# Patient Record
Sex: Female | Born: 1995 | Race: Black or African American | Hispanic: No | Marital: Single | Smoking: Never smoker
Health system: Southern US, Community
[De-identification: ages and names within clinical notes are randomized; demographics above are authoritative.]

## PROBLEM LIST (undated history)

## (undated) DIAGNOSIS — T7840XA Allergy, unspecified, initial encounter: Secondary | ICD-10-CM

## (undated) DIAGNOSIS — K219 Gastro-esophageal reflux disease without esophagitis: Secondary | ICD-10-CM

## (undated) DIAGNOSIS — N390 Urinary tract infection, site not specified: Secondary | ICD-10-CM

## (undated) HISTORY — DX: Gastro-esophageal reflux disease without esophagitis: K21.9

## (undated) HISTORY — DX: Allergy, unspecified, initial encounter: T78.40XA

## (undated) HISTORY — DX: Urinary tract infection, site not specified: N39.0

## (undated) HISTORY — PX: WISDOM TOOTH EXTRACTION: SHX21

---
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
## 2015-05-20 ENCOUNTER — Encounter: Payer: Self-pay | Admitting: Family

## 2015-05-20 NOTE — Progress Notes (Signed)
Error

## 2015-05-28 ENCOUNTER — Ambulatory Visit (INDEPENDENT_AMBULATORY_CARE_PROVIDER_SITE_OTHER): Payer: BLUE CROSS/BLUE SHIELD | Admitting: Internal Medicine

## 2015-05-28 ENCOUNTER — Encounter: Payer: Self-pay | Admitting: Internal Medicine

## 2015-05-28 VITALS — BP 104/68 | HR 93 | Temp 97.5°F | Ht 62.0 in | Wt 171.0 lb

## 2015-05-28 DIAGNOSIS — E669 Obesity, unspecified: Secondary | ICD-10-CM | POA: Diagnosis not present

## 2015-05-28 DIAGNOSIS — T7840XA Allergy, unspecified, initial encounter: Secondary | ICD-10-CM | POA: Diagnosis not present

## 2015-05-28 MED ORDER — METHYLPREDNISOLONE ACETATE 40 MG/ML IJ SUSP
40.0000 mg | Freq: Once | INTRAMUSCULAR | Status: AC
  Administered 2015-05-28: 40 mg via INTRAMUSCULAR

## 2015-05-28 MED ORDER — CETIRIZINE HCL 10 MG PO TABS: 10.0000 mg | ORAL_TABLET | Freq: Every day | ORAL | Status: DC

## 2015-05-28 MED ORDER — PREDNISONE 10 MG PO TABS: ORAL_TABLET | ORAL | Status: DC

## 2015-05-28 NOTE — Progress Notes (Signed)
Subjective:    Patient ID: Rachel Beck, female    DOB: Oct 29, 1995, 19 y.o.   MRN: [id number]  HPI She is here to establish with a new pcp.  She is concerned about itching.   She ate a tomato three days ago and had been itching all over.  Her mom is allergic to tomatoes and itches after eating them.  She has had tomatoes in the past and usually itches a little, but nothing this bad.  The other day her tongue felt numb, but it does not today.  She denies throat swelling or difficulty breathing. She took benadryl and it did not help.  She tried topical cortisone and it did not help.  Trying to lose weight:  She works out regularly and sees a Clinical research associate - works out at least three times a week.  She eats healthy - usually 6 meals a day.  She is frustrated by in inability to lose weight.   Medications and allergies reviewed with patient and updated if appropriate.  Patient Active Problem List   Diagnosis Date Noted  . Allergic reaction 05/28/2015  . Obese 05/28/2015    Past Medical History  Diagnosis Date  . Allergy   . UTI (lower urinary tract infection)     No past surgical history on file.  Social History   Social History  . Marital Status: Single    Spouse Name: N/A  . Number of Children: N/A  . Years of Education: N/A   Social History Main Topics  . Smoking status: Never Smoker   . Smokeless tobacco: Never Used  . Alcohol Use: No  . Drug Use: No  . Sexual Activity: Yes    Birth Control/ Protection: Condom   Other Topics Concern  . None   Social History Narrative   Exercises regularly      Part time school, working    Review of Systems  Constitutional: Negative for fever, chills, appetite change, fatigue and unexpected weight change.  HENT: Positive for trouble swallowing. Negative for hearing loss and sore throat.        No lip swelling  Eyes: Negative for visual disturbance.  Respiratory: Negative for cough, shortness of breath and wheezing.     Cardiovascular: Negative for chest pain, palpitations and leg swelling.  Gastrointestinal: Negative for nausea, abdominal pain, diarrhea and constipation.  Genitourinary: Negative for dysuria.  Musculoskeletal: Negative for myalgias, back pain and arthralgias.  Skin: Positive for rash (little bumps on hands and arms).       Whole body itching  Neurological: Negative for dizziness, light-headedness and headaches.  Psychiatric/Behavioral: Negative for dysphoric mood. The patient is not nervous/anxious.        Objective:   Filed Vitals:   05/28/15 1612  BP: 104/68  Pulse: 93  Temp: 97.5 F (36.4 C)   Filed Weights   05/28/15 1612  Weight: 171 lb (77.565 kg)   Body mass index is 31.27 kg/(m^2).   Physical Exam  Constitutional: She is oriented to person, place, and time. She appears well-developed and well-nourished. No distress.  HENT:  Head: Normocephalic and atraumatic.  Right Ear: External ear normal.  Left Ear: External ear normal.  Mouth/Throat: Oropharynx is clear and moist.  No throat swelling  Eyes: Conjunctivae are normal.  Neck: Normal range of motion. Neck supple. No tracheal deviation present. No thyromegaly present.  Cardiovascular: Normal rate, regular rhythm and normal heart sounds.   No murmur heard. Pulmonary/Chest: Effort normal and breath sounds  normal. No stridor. No respiratory distress. She has no wheezes.  Abdominal: Soft. She exhibits no distension. There is no tenderness.  Musculoskeletal: She exhibits no edema.  Lymphadenopathy:    She has no cervical adenopathy.  Neurological: She is alert and oriented to person, place, and time.  Skin: Skin is warm and dry. Rash (mild papular macular rash on arms, scratch marks from itching) noted. She is not diaphoretic.  Psychiatric: She has a normal mood and affect. Her behavior is normal.       Assessment & Plan:   Declined flu shot  See problem list for a/p

## 2015-05-28 NOTE — Assessment & Plan Note (Signed)
She is having whole body rash and itch - likely allergic reaction to tomatoes  Steroid injection here today in office Prednisone taper starting tomorrow Start zyrtec nightly for at least two weeks Will refer to an allergist - to confirm allergy  Reviewed possible side effects of prednisone - advised her to eat with food and take first thing in the morning.  She will call with any questions or concerns

## 2015-05-28 NOTE — Assessment & Plan Note (Signed)
Difficulty losing weight despite regular exercise and healthy diet Will check tsh Advised calorie counting/decreasing portions Continue regular exercise, working with trainer

## 2015-05-28 NOTE — Patient Instructions (Addendum)
Test(s) ordered today. Your results will be released to [facility] (or called to you) after review, usually within 72hours after test completion. If any changes need to be made, you will be notified at that same time.  All other Health Maintenance issues reviewed.   All recommended immunizations and age-appropriate screenings are up-to-date.  No immunizations administered today.   We gave you a steroid injection to help with the itching from the allergic reaction.    Medications reviewed and updated.  We will start a steroid taper to help with the allergic reaction - you should start this tomorrow. You should also take zrytec daily for at least two weeks.    Your prescription(s) have been submitted to your pharmacy. Please take as directed and contact our office if you believe you are having problem(s) with the medication(s).  A referral was ordered for the allergist  Please schedule followup as needed.   Food Allergy A food allergy is an abnormal reaction to a food (food allergen) by the body's defense system (immune system). Foods that commonly cause allergies are:  Milk.  Seafood.  Eggs.  Nuts.  Wheat.  Soy. CAUSES Food allergies happen when the immune system mistakenly sees a food as harmful and releases antibodies to fight it. SIGNS AND SYMPTOMS Symptoms may be mild or severe. They usually start minutes after the food is eaten, but they can occur even a few hours later. In people with a severe allergy, symptoms can start within seconds. Mild Symptoms  Nasal congestion.  Tingling in the mouth.  An itchy, red rash.  Vomiting.  Diarrhea. Severe Symptoms  Swelling of the lips, face, and tongue.  Swelling of the back of the mouth and throat.  Wheezing.  A hoarse voice.  Itchy, red, swollen areas of skin (hives).  Dizziness or light-headedness.  Fainting.  Trouble breathing, speaking, or swallowing.  Chest tightness.  Rapid heartbeat. DIAGNOSIS A  diagnosis is made with a physical exam, medical and family history, and one or more of the following:  Skin tests.  Blood tests.  A food diary.  The results of an elimination diet. The elimination diet involves removing foods from your diet and then adding them back in, one at a time. TREATMENT There is no cure for allergies. An allergic reaction can be treated with medicines, such as:  Antihistamines.  Steroids.  Respiratory inhalers.  Epinephrine. Severe symptoms can be a sign of a life-threatening reaction called anaphylaxis, and they require immediate treatment. Severe reactions usually need to be treated at a hospital. People who have had a severe reaction may be prescribed rescue medicines to take if they are accidentally exposed to an allergen. HOME CARE INSTRUCTIONS General Instructions  Avoid the foods that you are allergic to.  Read food labels before you eat packaged items. Look for ingredients you are allergic to.  When you are at a restaurant, tell your server that you have an allergy. If you are unsure of whether a meal has an ingredient that you are allergic to, ask your server.  Take medicines only as directed by your health care provider. Do not drive until the medicine has worn off, unless your health care provider gives you approval.  Inform all health care providers that you have a food allergy.  Contact your health care provider if you want to be tested for an allergy. If you have had an anaphylactic reaction before, you should never test yourself for an allergy without your health care provider's approval.  Instructions for People with Severe Allergies  Wear a medical alert bracelet or necklace that describes your allergy.  Carry your anaphylaxis kit or an epinephrine injection with you at all times. Use them as directed by your health care provider.  Make sure that you, your family members, and your employer know:  How to use an anaphylaxis kit.  How  to give an epinephrine injection.  Replace your epinephrine immediately after use, in case you have another reaction.  Seek medical care even after you take epinephrine. This is important because epinephrine can be followed by a delayed, life-threatening reaction. Instructions for People with a Potential Allergy  Follow the elimination diet as directed by your health care provider.  Keep a food diary as directed by your health care provider. Every day, write down:  What you eat and drink and when.  What symptoms you have and when. SEEK MEDICAL CARE IF:  Your symptoms have not gone away within 2 days.  Your symptoms get worse.  You develop new symptoms. SEEK IMMEDIATE MEDICAL CARE IF:  You use epinephrine.  You are having a severe allergic reaction. Symptoms of a severe reaction include:  Swelling of the lips, face, and tongue.  Swelling of the back of the mouth and throat.  Wheezing.  A hoarse voice.  Hives.  Dizziness or light-headedness.  Fainting.  Trouble breathing, speaking, or swallowing.  Chest tightness.  Rapid heartbeat.   This information is not intended to replace advice given to you by your health care provider. Make sure you discuss any questions you have with your health care provider.   Document Released: 08/04/2000 Document Revised: 08/28/2014 Document Reviewed: 05/19/2014 Elsevier Interactive Patient Education [employer].

## 2015-05-28 NOTE — Progress Notes (Signed)
Pre visit review using our clinic review tool, if applicable. No additional management support is needed unless otherwise documented below in the visit note. 

## 2015-08-10 ENCOUNTER — Ambulatory Visit (INDEPENDENT_AMBULATORY_CARE_PROVIDER_SITE_OTHER): Payer: BLUE CROSS/BLUE SHIELD | Admitting: Urgent Care

## 2015-08-10 VITALS — BP 100/62 | HR 75 | Temp 98.3°F | Resp 16 | Ht 61.75 in | Wt 173.6 lb

## 2015-08-10 DIAGNOSIS — N3090 Cystitis, unspecified without hematuria: Secondary | ICD-10-CM | POA: Diagnosis not present

## 2015-08-10 DIAGNOSIS — R309 Painful micturition, unspecified: Secondary | ICD-10-CM

## 2015-08-10 DIAGNOSIS — R350 Frequency of micturition: Secondary | ICD-10-CM | POA: Diagnosis not present

## 2015-08-10 DIAGNOSIS — R319 Hematuria, unspecified: Secondary | ICD-10-CM | POA: Diagnosis not present

## 2015-08-10 DIAGNOSIS — Z7251 High risk heterosexual behavior: Secondary | ICD-10-CM | POA: Diagnosis not present

## 2015-08-10 LAB — POC MICROSCOPIC URINALYSIS (UMFC): Mucus: ABSENT

## 2015-08-10 LAB — POCT URINALYSIS DIP (MANUAL ENTRY)
Bilirubin, UA: NEGATIVE
Glucose, UA: NEGATIVE
Nitrite, UA: POSITIVE — AB
Spec Grav, UA: 1.025
Urobilinogen, UA: 1
pH, UA: 6.5

## 2015-08-10 MED ORDER — CIPROFLOXACIN HCL 500 MG PO TABS: 500.0000 mg | ORAL_TABLET | Freq: Two times a day (BID) | ORAL | Status: DC

## 2015-08-10 NOTE — Progress Notes (Signed)
    MRN: [id number] DOB: 25-Feb-1996  Subjective:   Rachel Beck is a 19 y.o. female presenting for chief complaint of Urinary Tract Infection and painful urination  Reports 4 day history of moderately painful urination, hematuria, urinary frequency, urinary urgency. Has not tried any medications for relief. Denies fever, n/v, abdominal pain, pelvic pain, genital rashes, abnormal vaginal discharge, flank pain. Patient is sexually active and uses condoms for protection against STIs only some of the time. She declines testing for pregnancy and STIs today. Patient is not currently on her menstrual cycle. Patient does admit history of pyelonephritis, UTI in 2014. Denies smoking cigarettes. She has an occasional alcohol drink. Denies family history of diabetes.  Rachel Beck has a current medication list which includes the following prescription(s): cetirizine. Also has No Known Allergies.  Rachel Beck  has a past medical history of Allergy and UTI (lower urinary tract infection). Also  has no past surgical history on file.  Objective:   Vitals: BP 100/62 mmHg  Pulse 75  Temp(Src) 98.3 F (36.8 C) (Oral)  Resp 16  Ht 5' 1.75" (1.568 m)  Wt 173 lb 9.6 oz (78.744 kg)  BMI 32.03 kg/m2  SpO2 99%  LMP 06/29/2015  Physical Exam  Constitutional: She is oriented to person, place, and time. She appears well-developed and well-nourished.  Cardiovascular: Normal rate, regular rhythm and intact distal pulses.  Exam reveals no gallop and no friction rub.   No murmur heard. Pulmonary/Chest: No respiratory distress. She has no wheezes. She has no rales.  Abdominal: Soft. Bowel sounds are normal. She exhibits no distension and no mass. There is no tenderness.  Neurological: She is alert and oriented to person, place, and time.  Skin: Skin is warm and dry. No rash noted. No erythema. No pallor.   Results for orders placed or performed in visit on 08/10/15 (from the past 24 hour(s))  POCT Microscopic  Urinalysis ([facility])     Status: Abnormal   Collection Time: 08/10/15 10:06 AM  Result Value Ref Range   WBC,UR,HPF,POC Few (A) None WBC/hpf   RBC,UR,HPF,POC Few (A) None RBC/hpf   Bacteria Moderate (A) None, Too numerous to count   Mucus Absent Absent   Epithelial Cells, UR Per Microscopy Few (A) None, Too numerous to count cells/hpf  POCT urinalysis dipstick     Status: Abnormal   Collection Time: 08/10/15 10:06 AM  Result Value Ref Range   Color, UA yellow yellow   Clarity, UA clear clear   Glucose, UA negative negative   Bilirubin, UA negative negative   Ketones, POC UA trace (5) (A) negative   Spec Grav, UA 1.025    Blood, UA moderate (A) negative   pH, UA 6.5    Protein Ur, POC trace (A) negative   Urobilinogen, UA 1.0    Nitrite, UA Positive (A) Negative   Leukocytes, UA Trace (A) Negative   Assessment and Plan :   1. Cystitis 2. Frequency of urination 3. Painful urination 4. Blood in urine - Urine culture pending, start Cipro x7 days - Advised aggressive hydration, Azo for symptomatic relief - RTC in 1 week if no improvement  5. Unprotected sexual intercourse - Counseled on safe sex practices. Patient declined UPT, STI testing. I also recommended urinating before and after sex to avoid UTIs associated with sex. Patient to rtc if she would like STI testing.  [name], PA-C [facility] [phone] 08/10/2015 10:00 AM

## 2015-08-10 NOTE — Patient Instructions (Signed)
Urinary Tract Infection Urinary tract infections (UTIs) can develop anywhere along your urinary tract. Your urinary tract is your body's drainage system for removing wastes and extra water. Your urinary tract includes two kidneys, two ureters, a bladder, and a urethra. Your kidneys are a pair of bean-shaped organs. Each kidney is about the size of your fist. They are located below your ribs, one on each side of your spine. CAUSES Infections are caused by microbes, which are microscopic organisms, including fungi, viruses, and bacteria. These organisms are so small that they can only be seen through a microscope. Bacteria are the microbes that most commonly cause UTIs. SYMPTOMS  Symptoms of UTIs may vary by age and gender of the patient and by the location of the infection. Symptoms in young women typically include a frequent and intense urge to urinate and a painful, burning feeling in the bladder or urethra during urination. Older women and men are more likely to be tired, shaky, and weak and have muscle aches and abdominal pain. A fever may mean the infection is in your kidneys. Other symptoms of a kidney infection include pain in your back or sides below the ribs, nausea, and vomiting. DIAGNOSIS To diagnose a UTI, your caregiver will ask you about your symptoms. Your caregiver will also ask you to provide a urine sample. The urine sample will be tested for bacteria and white blood cells. White blood cells are made by your body to help fight infection. TREATMENT  Typically, UTIs can be treated with medication. Because most UTIs are caused by a bacterial infection, they usually can be treated with the use of antibiotics. The choice of antibiotic and length of treatment depend on your symptoms and the type of bacteria causing your infection. HOME CARE INSTRUCTIONS  If you were prescribed antibiotics, take them exactly as your caregiver instructs you. Finish the medication even if you feel better after  you have only taken some of the medication.  Drink enough water and fluids to keep your urine clear or pale yellow.  Avoid caffeine, tea, and carbonated beverages. They tend to irritate your bladder.  Empty your bladder often. Avoid holding urine for long periods of time.  Empty your bladder before and after sexual intercourse.  After a bowel movement, women should cleanse from front to back. Use each tissue only once. SEEK MEDICAL CARE IF:   You have back pain.  You develop a fever.  Your symptoms do not begin to resolve within 3 days. SEEK IMMEDIATE MEDICAL CARE IF:   You have severe back pain or lower abdominal pain.  You develop chills.  You have nausea or vomiting.  You have continued burning or discomfort with urination. MAKE SURE YOU:   Understand these instructions.  Will watch your condition.  Will get help right away if you are not doing well or get worse.   This information is not intended to replace advice given to you by your health care provider. Make sure you discuss any questions you have with your health care provider.   Document Released: 05/17/2005 Document Revised: 04/28/2015 Document Reviewed: 09/15/2011 Elsevier Interactive Patient Education 2016 [place] Sex Safe sex is about reducing the risk of giving or getting a sexually transmitted disease (STD). STDs are spread through sexual contact involving the genitals, mouth, or rectum. Some STDs can be cured and others cannot. Safe sex can also prevent unintended pregnancies.  WHAT ARE SOME SAFE SEX PRACTICES?  Limit your sexual activity to  only one partner who is having sex with only you.  Talk to your partner about his or her past partners, past STDs, and drug use.  Use a condom every time you have sexual intercourse. This includes vaginal, oral, and anal sexual activity. Both females and males should wear condoms during oral sex. Only use latex or polyurethane condoms and  water-based lubricants. Using petroleum-based lubricants or oils to lubricate a condom will weaken the condom and increase the chance that it will break. The condom should be in place from the beginning to the end of sexual activity. Wearing a condom reduces, but does not completely eliminate, your risk of getting or giving an STD. STDs can be spread by contact with infected body fluids and skin.  Get vaccinated for hepatitis B and HPV.  Avoid alcohol and recreational drugs, which can affect your judgment. You may forget to use a condom or participate in high-risk sex.  For females, avoid douching after sexual intercourse. Douching can spread an infection farther into the reproductive tract.  Check your body for signs of sores, blisters, rashes, or unusual discharge. See your health care provider if you notice any of these signs.  Avoid sexual contact if you have symptoms of an infection or are being treated for an STD. If you or your partner has herpes, avoid sexual contact when blisters are present. Use condoms at all other times.  If you are at risk of being infected with HIV, it is recommended that you take a prescription medicine daily to prevent HIV infection. This is called pre-exposure prophylaxis (PrEP). You are considered at risk if:  You are a man who has sex with other men (MSM).  You are a heterosexual man or woman who is sexually active with more than one partner.  You take drugs by injection.  You are sexually active with a partner who has HIV.  Talk with your health care provider about whether you are at high risk of being infected with HIV. If you choose to begin PrEP, you should first be tested for HIV. You should then be tested every 3 months for as long as you are taking PrEP.  See your health care provider for regular screenings, exams, and tests for other STDs. Before having sex with a new partner, each of you should be screened for STDs and should talk about the results  with each other. WHAT ARE THE BENEFITS OF SAFE SEX?   There is less chance of getting or giving an STD.  You can prevent unwanted or unintended pregnancies.  By discussing safe sex concerns with your partner, you may increase feelings of intimacy, comfort, trust, and honesty between the two of you.   This information is not intended to replace advice given to you by your health care provider. Make sure you discuss any questions you have with your health care provider.   Document Released: 09/14/2004 Document Revised: 08/28/2014 Document Reviewed: 01/29/2012 Elsevier Interactive Patient Education [employer].

## 2015-08-12 LAB — URINE CULTURE

## 2015-11-11 ENCOUNTER — Telehealth: Payer: Self-pay | Admitting: Internal Medicine

## 2015-11-11 NOTE — Telephone Encounter (Signed)
Pt states at her established appt she had a physical and it wasn't coded correctly. Can you please help her with this

## 2015-11-11 NOTE — Telephone Encounter (Signed)
Pt was not aware of her benefits. Explained that we were not aware of specific benefits for each pt. Advised to contact insurance and verify what she was billed for and to also call our billing department for the same information to confirm that they match.

## 2016-04-29 ENCOUNTER — Emergency Department (HOSPITAL_COMMUNITY)
Admission: EM | Admit: 2016-04-29 | Discharge: 2016-04-29 | Disposition: A | Discharge status: Home / Self Care | Payer: BLUE CROSS/BLUE SHIELD | Attending: Emergency Medicine | Admitting: Emergency Medicine

## 2016-04-29 ENCOUNTER — Encounter (HOSPITAL_COMMUNITY): Payer: Self-pay | Admitting: Emergency Medicine

## 2016-04-29 DIAGNOSIS — Y999 Unspecified external cause status: Secondary | ICD-10-CM | POA: Insufficient documentation

## 2016-04-29 DIAGNOSIS — Y92410 Unspecified street and highway as the place of occurrence of the external cause: Secondary | ICD-10-CM | POA: Diagnosis not present

## 2016-04-29 DIAGNOSIS — M549 Dorsalgia, unspecified: Secondary | ICD-10-CM | POA: Diagnosis not present

## 2016-04-29 DIAGNOSIS — Y939 Activity, unspecified: Secondary | ICD-10-CM | POA: Insufficient documentation

## 2016-04-29 NOTE — Discharge Instructions (Signed)
Please read attached information. If you experience any new or worsening signs or symptoms please return to the emergency room for evaluation. Please follow-up with your primary care provider or specialist as discussed.  °
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
## 2016-04-29 NOTE — ED Notes (Signed)
Declined W/C at D/C and was escorted to lobby by RN. 
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
## 2017-09-17 ENCOUNTER — Ambulatory Visit (INDEPENDENT_AMBULATORY_CARE_PROVIDER_SITE_OTHER): Payer: Self-pay | Admitting: Family Medicine

## 2017-09-17 ENCOUNTER — Encounter: Payer: Self-pay | Admitting: Family Medicine

## 2017-09-17 DIAGNOSIS — Z124 Encounter for screening for malignant neoplasm of cervix: Secondary | ICD-10-CM

## 2017-09-17 DIAGNOSIS — Z01419 Encounter for gynecological examination (general) (routine) without abnormal findings: Secondary | ICD-10-CM | POA: Insufficient documentation

## 2017-09-17 NOTE — Progress Notes (Signed)
Pt states she had 1 abnormal menstrual cycle - bleeding was heavy and lasted for 2 weeks. She does not see any other doctor Rachel Beck issues.

## 2017-09-17 NOTE — Patient Instructions (Signed)

## 2017-09-17 NOTE — Progress Notes (Signed)
Gynecology Annual Exam  PCP: Binnie Rail, MD  Chief Complaint:  Chief Complaint  Patient presents with  . Gynecologic Exam    History of Present Illness: Patient is a 22 y.o. G0P0000 presents for annual exam. Complains of single 2 week period last month that occurred after she took plan B.     The patient is sexually active. She currently uses None for contraception. She does not dyspareunia.  The patient does not perform self breast exams.  There is no notable family history of breast or ovarian cancer in her family.    Review of Systems: Review of Systems  Constitutional: Negative for chills and fever.  HENT: Negative for hearing loss and tinnitus.   Eyes: Negative for blurred vision and double vision.  Respiratory: Negative for cough and hemoptysis.   Cardiovascular: Negative for chest pain and palpitations.  Gastrointestinal: Negative for heartburn and nausea.  Genitourinary: Negative for dysuria and urgency.  Musculoskeletal: Negative for myalgias and neck pain.  Skin: Negative for itching and rash.  Neurological: Negative for dizziness and headaches.    Past Medical History:  Past Medical History:  Diagnosis Date  . Allergy   . UTI (lower urinary tract infection)     Past Surgical History:  History reviewed. No pertinent surgical history.  Gynecologic History:  Patient's last menstrual period was 09/01/2017 (exact date).   Obstetric History: G0P0000  Family History:  Family History  Problem Relation Age of Onset  . Diabetes Father   . Seizures Sister   . Hypertension Paternal Grandmother   . Diabetes Paternal Grandmother     Social History:  Social History   Socioeconomic History  . Marital status: Single    Spouse name: Not on file  . Number of children: Not on file  . Years of education: Not on file  . Highest education level: Not on file  Social Needs  . Financial resource strain: Not on file  . Food insecurity - worry: Not on file  . Food  insecurity - inability: Not on file  . Transportation needs - medical: Not on file  . Transportation needs - non-medical: Not on file  Occupational History  . Not on file  Tobacco Use  . Smoking status: Never Smoker  . Smokeless tobacco: Never Used  Substance and Sexual Activity  . Alcohol use: Yes    Comment: occasionally  . Drug use: No  . Sexual activity: Yes    Birth control/protection: Condom  Other Topics Concern  . Not on file  Social History Narrative   Exercises regularly      Part time school, working    Allergies:  No Known Allergies  Medications: Prior to Admission medications   Medication Sig Start Date End Date Taking? Authorizing Provider  cetirizine (ZYRTEC) 10 MG tablet Take 1 tablet (10 mg total) by mouth daily. Patient not taking: Reported on 08/10/2015 05/28/15   Binnie Rail, MD    Physical Exam Vitals: Blood pressure 125/77, pulse 79, height 5\' 2"  (1.575 m), weight 175 lb 11.2 oz (79.7 kg), last menstrual period 09/01/2017.  General: NAD HEENT: normocephalic, anicteric Thyroid: no enlargement, no palpable nodules Pulmonary: No increased work of breathing, CTAB Cardiovascular: RRR, distal pulses 2+ Breast: Breast symmetrical, no tenderness, no palpable nodules or masses, no skin or nipple retraction present, no nipple discharge.  No axillary or supraclavicular lymphadenopathy. Abdomen: NABS, soft, non-tender, non-distended.  Umbilicus without lesions.  No hepatomegaly, splenomegaly or masses palpable. No evidence of hernia  Genitourinary:  External: Normal external female genitalia.  Normal urethral meatus, normal  Bartholin's and Skene's glands.    Vagina: Normal vaginal mucosa, no evidence of prolapse.    Cervix: Grossly normal in appearance, no bleeding  Uterus: Non-enlarged, mobile, normal contour.  No CMT  Adnexa: ovaries non-enlarged, no adnexal masses  Rectal: deferred  Lymphatic: no evidence of inguinal lymphadenopathy Extremities: no  edema, erythema, or tenderness Neurologic: Grossly intact Psychiatric: mood appropriate, affect full  Female chaperone present for pelvic and breast  portions of the physical exam    Assessment: 22 y.o. G0P0000 here for wellness exam.   Plan: Pap smear performed today She declines birth control today.

## 2017-09-20 LAB — CYTOLOGY - PAP: DIAGNOSIS: NEGATIVE

## 2017-10-16 ENCOUNTER — Encounter: Payer: BLUE CROSS/BLUE SHIELD | Admitting: Family Medicine

## 2019-04-25 ENCOUNTER — Emergency Department (HOSPITAL_COMMUNITY)
Admission: EM | Admit: 2019-04-25 | Discharge: 2019-04-25 | Discharge status: Against Medical Advice | Payer: Self-pay | Attending: Emergency Medicine | Admitting: Emergency Medicine

## 2019-04-25 ENCOUNTER — Encounter (HOSPITAL_COMMUNITY): Payer: Self-pay | Admitting: Emergency Medicine

## 2019-04-25 ENCOUNTER — Other Ambulatory Visit: Payer: Self-pay

## 2019-04-25 DIAGNOSIS — Z5321 Procedure and treatment not carried out due to patient leaving prior to being seen by health care provider: Secondary | ICD-10-CM | POA: Insufficient documentation

## 2019-04-25 NOTE — ED Triage Notes (Signed)
C/o heavy vaginal bleeding for about 4 weeks with lots of clotting.

## 2019-04-26 ENCOUNTER — Emergency Department (HOSPITAL_BASED_OUTPATIENT_CLINIC_OR_DEPARTMENT_OTHER)
Admission: EM | Admit: 2019-04-26 | Discharge: 2019-04-26 | Disposition: A | Discharge status: Home / Self Care | Payer: BC Managed Care – PPO | Attending: Emergency Medicine | Admitting: Emergency Medicine

## 2019-04-26 ENCOUNTER — Other Ambulatory Visit: Payer: Self-pay

## 2019-04-26 ENCOUNTER — Encounter (HOSPITAL_BASED_OUTPATIENT_CLINIC_OR_DEPARTMENT_OTHER): Payer: Self-pay | Admitting: Emergency Medicine

## 2019-04-26 DIAGNOSIS — N938 Other specified abnormal uterine and vaginal bleeding: Secondary | ICD-10-CM

## 2019-04-26 DIAGNOSIS — N939 Abnormal uterine and vaginal bleeding, unspecified: Secondary | ICD-10-CM | POA: Insufficient documentation

## 2019-04-26 DIAGNOSIS — R42 Dizziness and giddiness: Secondary | ICD-10-CM | POA: Diagnosis not present

## 2019-04-26 LAB — CBC WITH DIFFERENTIAL/PLATELET
Abs Immature Granulocytes: 0.01 10*3/uL (ref 0.00–0.07)
Basophils Absolute: 0 10*3/uL (ref 0.0–0.1)
Basophils Relative: 0 %
Eosinophils Absolute: 0 10*3/uL (ref 0.0–0.5)
Eosinophils Relative: 1 %
HCT: 35.8 % — ABNORMAL LOW (ref 36.0–46.0)
Hemoglobin: 11.6 g/dL — ABNORMAL LOW (ref 12.0–15.0)
Immature Granulocytes: 0 %
Lymphocytes Relative: 39 %
Lymphs Abs: 1.8 10*3/uL (ref 0.7–4.0)
MCH: 27.1 pg (ref 26.0–34.0)
MCHC: 32.4 g/dL (ref 30.0–36.0)
MCV: 83.6 fL (ref 80.0–100.0)
Monocytes Absolute: 0.4 10*3/uL (ref 0.1–1.0)
Monocytes Relative: 9 %
Neutro Abs: 2.3 10*3/uL (ref 1.7–7.7)
Neutrophils Relative %: 51 %
Platelets: 280 10*3/uL (ref 150–400)
RBC: 4.28 MIL/uL (ref 3.87–5.11)
RDW: 13.7 % (ref 11.5–15.5)
WBC: 4.6 10*3/uL (ref 4.0–10.5)
nRBC: 0 % (ref 0.0–0.2)

## 2019-04-26 LAB — URINALYSIS, ROUTINE W REFLEX MICROSCOPIC
Bilirubin Urine: NEGATIVE
Glucose, UA: NEGATIVE mg/dL
Ketones, ur: 15 mg/dL — AB
Leukocytes,Ua: NEGATIVE
Nitrite: NEGATIVE
Protein, ur: NEGATIVE mg/dL
Specific Gravity, Urine: 1.025 (ref 1.005–1.030)
pH: 6.5 (ref 5.0–8.0)

## 2019-04-26 LAB — URINALYSIS, MICROSCOPIC (REFLEX)
RBC / HPF: >50 RBC/hpf (ref 0–5)
WBC, UA: NONE SEEN WBC/hpf (ref 0–5)

## 2019-04-26 LAB — BASIC METABOLIC PANEL
Anion gap: 11 (ref 5–15)
BUN: 14 mg/dL (ref 6–20)
CO2: 25 mmol/L (ref 22–32)
Calcium: 8.9 mg/dL (ref 8.9–10.3)
Chloride: 99 mmol/L (ref 98–111)
Creatinine, Ser: 0.7 mg/dL (ref 0.44–1.00)
GFR calc Af Amer: >60 mL/min (ref >60)
GFR calc non Af Amer: >60 mL/min (ref >60)
Glucose, Bld: 89 mg/dL (ref 70–99)
Potassium: 3.7 mmol/L (ref 3.5–5.1)
Sodium: 135 mmol/L (ref 135–145)

## 2019-04-26 LAB — PREGNANCY, URINE: Preg Test, Ur: NEGATIVE

## 2019-04-26 MED ORDER — ESTRADIOL VALERATE-DIENOGEST 3/2-2/2-3/1 MG PO TABS: 1.0000 | ORAL_TABLET | Freq: Every day | ORAL | 11 refills | Status: DC

## 2019-04-26 MED ORDER — FERROUS SULFATE 325 (65 FE) MG PO TABS: 325.0000 mg | ORAL_TABLET | Freq: Every day | ORAL | 0 refills | Status: DC

## 2019-04-26 NOTE — ED Provider Notes (Signed)
[facility] EMERGENCY DEPARTMENT Provider Note   CSN: [id number] Arrival date & time: 04/26/19  1044     History   Chief Complaint Chief Complaint  Patient presents with  . Vaginal Bleeding    HPI Rachel Beck is a 23 y.o. female.     HPI Patient states she is had vaginal bleeding daily for the past month.  It is typically going through 2-3 pads daily.  Has occasional pelvic cramping.  States that sometimes she has lightheadedness when she stands up.  Has appointment to see her OB/GYN on 9/22. Past Medical History:  Diagnosis Date  . Allergy   . UTI (lower urinary tract infection)     Patient Active Problem List   Diagnosis Date Noted  . Well woman exam 09/17/2017  . Allergic reaction 05/28/2015  . Obese 05/28/2015    History reviewed. No pertinent surgical history.   OB History    Gravida  0   Para  0   Term  0   Preterm  0   AB  0   Living  0     SAB  0   TAB  0   Ectopic  0   Multiple  0   Live Births  0            Home Medications    Prior to Admission medications   Medication Sig Start Date End Date Taking? Authorizing Provider  cetirizine (ZYRTEC) 10 MG tablet Take 1 tablet (10 mg total) by mouth daily. Patient not taking: Reported on 08/10/2015 05/28/15   Binnie Rail, MD  Estradiol Valerate-Dienogest (NATAZIA) 3/2-2/2-3/1 MG tablet Take 1 tablet by mouth daily. 04/26/19   Julianne Rice, MD  ferrous sulfate 325 (65 FE) MG tablet Take 1 tablet (325 mg total) by mouth daily. 04/26/19   Julianne Rice, MD    Family History Family History  Problem Relation Age of Onset  . Diabetes Father   . Seizures Sister   . Hypertension Paternal Grandmother   . Diabetes Paternal Grandmother     Social History Social History   Tobacco Use  . Smoking status: Never Smoker  . Smokeless tobacco: Never Used  Substance Use Topics  . Alcohol use: Yes    Comment: occasionally  . Drug use: No     Allergies   Patient has  no known allergies.   Review of Systems Review of Systems  Constitutional: Positive for fatigue. Negative for chills and fever.  Respiratory: Negative for cough and shortness of breath.   Cardiovascular: Negative for chest pain.  Gastrointestinal: Negative for abdominal pain, diarrhea, nausea and vomiting.  Genitourinary: Positive for pelvic pain and vaginal bleeding. Negative for dysuria, flank pain and vaginal discharge.  Musculoskeletal: Positive for back pain and myalgias. Negative for neck pain and neck stiffness.  Skin: Negative for rash and wound.  Neurological: Positive for light-headedness. Negative for weakness, numbness and headaches.  All other systems reviewed and are negative.    Physical Exam Updated Vital Signs BP 132/82 (BP Location: Right Arm)   Pulse 100   Temp 98.8 F (37.1 C) (Oral)   Resp 16   LMP 03/28/2019   SpO2 99%   Physical Exam Vitals signs and nursing note reviewed.  Constitutional:      General: She is not in acute distress.    Appearance: Normal appearance. She is well-developed. She is not ill-appearing.  HENT:     Head: Normocephalic and atraumatic.     Nose:  Nose normal.     Mouth/Throat:     Mouth: Mucous membranes are moist.  Eyes:     Extraocular Movements: Extraocular movements intact.     Pupils: Pupils are equal, round, and reactive to light.  Neck:     Musculoskeletal: Normal range of motion and neck supple. No neck rigidity or muscular tenderness.  Cardiovascular:     Rate and Rhythm: Normal rate and regular rhythm.     Heart sounds: No murmur. No friction rub. No gallop.   Pulmonary:     Effort: Pulmonary effort is normal.     Breath sounds: Normal breath sounds.  Abdominal:     General: Bowel sounds are normal.     Palpations: Abdomen is soft.     Tenderness: There is abdominal tenderness. There is no guarding or rebound.     Comments: Mild suprapubic tenderness to palpation.  No rebound or guarding.  Musculoskeletal:  Normal range of motion.        General: No swelling, tenderness, deformity or signs of injury.     Right lower leg: No edema.     Left lower leg: No edema.     Comments: No CVA tenderness bilaterally.  Lymphadenopathy:     Cervical: No cervical adenopathy.  Skin:    General: Skin is warm and dry.     Findings: No erythema or rash.  Neurological:     General: No focal deficit present.     Mental Status: She is alert and oriented to person, place, and time.  Psychiatric:        Mood and Affect: Mood normal.        Behavior: Behavior normal.      ED Treatments / Results  Labs (all labs ordered are listed, but only abnormal results are displayed) Labs Reviewed  URINALYSIS, ROUTINE W REFLEX MICROSCOPIC - Abnormal; Notable for the following components:      Result Value   Hgb urine dipstick LARGE (*)    Ketones, ur 15 (*)    All other components within normal limits  CBC WITH DIFFERENTIAL/PLATELET - Abnormal; Notable for the following components:   Hemoglobin 11.6 (*)    HCT 35.8 (*)    All other components within normal limits  URINALYSIS, MICROSCOPIC (REFLEX) - Abnormal; Notable for the following components:   Bacteria, UA MANY (*)    All other components within normal limits  PREGNANCY, URINE  BASIC METABOLIC PANEL    EKG None  Radiology No results found.  Procedures Procedures (including critical care time)  Medications Ordered in ED Medications - No data to display   Initial Impression / Assessment and Plan / ED Course  I have reviewed the triage vital signs and the nursing notes.  Pertinent labs & imaging results that were available during my care of the patient were reviewed by me and considered in my medical decision making (see chart for details).        Patient does have drop in her hemoglobin compared to level several years ago.  She is not at a point where she needs to be transfused.  Will start estrogen-progesterone contraceptive and iron  supplementation.  Patient has appointment to follow-up this month.  Her OB can make changes at that point.  Return precautions given.  Final Clinical Impressions(s) / ED Diagnoses   Final diagnoses:  None    ED Discharge Orders         Ordered    Estradiol Valerate-Dienogest (NATAZIA) 3/2-2/2-3/1 MG tablet  Daily     04/26/19 1348    ferrous sulfate 325 (65 FE) MG tablet  Daily     04/26/19 1348           Julianne Rice, MD 04/26/19 1349

## 2019-04-26 NOTE — ED Triage Notes (Signed)
Vaginal bleeding x 4 weeks.

## 2019-05-13 ENCOUNTER — Other Ambulatory Visit: Payer: Self-pay | Admitting: Nurse Practitioner

## 2019-05-13 ENCOUNTER — Ambulatory Visit (INDEPENDENT_AMBULATORY_CARE_PROVIDER_SITE_OTHER): Payer: BC Managed Care – PPO | Admitting: Nurse Practitioner

## 2019-05-13 ENCOUNTER — Encounter: Payer: Self-pay | Admitting: Nurse Practitioner

## 2019-05-13 ENCOUNTER — Other Ambulatory Visit: Payer: Self-pay

## 2019-05-13 VITALS — BP 112/79 | HR 87 | Temp 98.7°F | Wt 185.4 lb

## 2019-05-13 DIAGNOSIS — N939 Abnormal uterine and vaginal bleeding, unspecified: Secondary | ICD-10-CM

## 2019-05-13 DIAGNOSIS — Z6833 Body mass index (BMI) 33.0-33.9, adult: Secondary | ICD-10-CM | POA: Diagnosis not present

## 2019-05-13 DIAGNOSIS — Z113 Encounter for screening for infections with a predominantly sexual mode of transmission: Secondary | ICD-10-CM | POA: Diagnosis not present

## 2019-05-13 NOTE — Progress Notes (Signed)
   GYNECOLOGY OFFICE VISIT NOTE   History:  23 y.o. G0P0000 here today for irregular and extended vaginal bleeding. She has been seen in the ER and that note and labs reviewed.  She has noticed some occasional vaginal discharge but nothing that is very borthersome.  Additionally in the past 3 days, she has had lower abdominal pain that is different than what she usually has.  She is on birth control pills right now, so the extended vaginal bleeding has stopped.    Past Medical History:  Diagnosis Date  . Allergy   . UTI (lower urinary tract infection)     History reviewed. No pertinent surgical history.  The following portions of the patient's history were reviewed and updated as appropriate: allergies, current medications, past family history, past medical history, past social history, past surgical history and problem list.   Health Maintenance:  Normal pap in 2019.   Review of Systems:  Pertinent items noted in HPI and remainder of comprehensive ROS otherwise negative.  Objective:  Physical Exam BP 112/79   Pulse 87   Temp 98.7 F (37.1 C)   Wt 185 lb 6.4 oz (84.1 kg)   BMI 33.91 kg/m  CONSTITUTIONAL: Well-developed, well-nourished female in no acute distress.  HENT:  Normocephalic, atraumatic. External right and left ear normal.  NECK: Normal range of motion SKIN: Skin is warm and dry. No rash noted. Not diaphoretic. No erythema. No pallor. NEUROLOGIC: Alert and oriented to person, place, and time. Normal muscle tone coordination. No cranial nerve deficit noted. PSYCHIATRIC: Normal mood and affect. Normal behavior. Normal judgment and thought content. ABDOMEN: Soft, no distention noted.  No masses.  No tenderness today.   PELVIC: Deferred MUSCULOSKELETAL: Normal range of motion. No edema noted.  Labs and Imaging No results found.  Assessment & Plan:  1. Abnormal vaginal bleeding No GC/Chlam testing was done in the ER.  HGB 11.2 in ER.  Client uses condoms always.   Discussed need for testing and client in agreement for a self swab.  Wants to continue pills for now but is worried about the lower abdominal pain.  Will investigate with ultrasound  - client very much desires to have [place].  Discussed there may be no readily available cause that can be identified for the extended bleeding but these tests - self swab and [place] are appropriate measures for evaluation.  - Cervicovaginal ancillary only( [place]) - US Pelvis Complete; Future - US Transvaginal Non-OB; Future  2. BMI 33.0-33.9,adult Weight loss advised for good health with healthy food and exercise.  Routine preventative health maintenance measures emphasized. Please refer to After Visit Summary for other counseling recommendations.   Return in about 3 months (around 08/12/2019).  For BP check on pills.  Will send MyChart message about self swab results and ultrasound results.   Total face-to-face time with patient: 15 minutes.  Over 50% of encounter was spent on counseling and coordination of care.  Earlie Server, RN, MSN, NP-BC Nurse Practitioner, [facility] for [employer], [facility] 05/13/2019 8:19 PM

## 2019-05-13 NOTE — Patient Instructions (Signed)

## 2019-05-14 LAB — CERVICOVAGINAL ANCILLARY ONLY
Bacterial Vaginitis (gardnerella): NEGATIVE
Candida Glabrata: NEGATIVE
Candida Vaginitis: NEGATIVE
Molecular Disclaimer: NEGATIVE
Molecular Disclaimer: NEGATIVE
Molecular Disclaimer: NEGATIVE
Molecular Disclaimer: NORMAL
Trichomonas: NEGATIVE

## 2019-05-15 LAB — CERVICOVAGINAL ANCILLARY ONLY
Chlamydia: NEGATIVE
Neisseria Gonorrhea: NEGATIVE

## 2019-05-21 ENCOUNTER — Ambulatory Visit (HOSPITAL_COMMUNITY)
Admission: RE | Admit: 2019-05-21 | Discharge: 2019-05-21 | Disposition: A | Discharge status: Home / Self Care | Payer: BC Managed Care – PPO | Source: Ambulatory Visit | Attending: Nurse Practitioner | Admitting: Nurse Practitioner

## 2019-05-21 ENCOUNTER — Other Ambulatory Visit: Payer: Self-pay

## 2019-05-21 DIAGNOSIS — N939 Abnormal uterine and vaginal bleeding, unspecified: Secondary | ICD-10-CM | POA: Diagnosis not present

## 2019-05-26 ENCOUNTER — Telehealth: Payer: Self-pay | Admitting: Family Medicine

## 2019-05-26 NOTE — Telephone Encounter (Signed)
Patient was called and given the next available appointment to see a provider. She was not happy, and stated that was too long. She stated no one has called her about anything, and she was told she would get called. She wants a call back today.

## 2019-05-27 ENCOUNTER — Telehealth (INDEPENDENT_AMBULATORY_CARE_PROVIDER_SITE_OTHER): Payer: BC Managed Care – PPO

## 2019-05-27 DIAGNOSIS — N926 Irregular menstruation, unspecified: Secondary | ICD-10-CM

## 2019-05-27 NOTE — Telephone Encounter (Signed)
Pt called front office to schedule follow up appt and requested a nurse call her back.   Called pt to follow up. Pt is reporting continued vaginal bleeding. Pt began birth control pills on 9/5 and reports bleeding stopped for 1 week after starting birth control, but has now returned. Pt states bleeding is becoming lighter over time. Notified pt that we would like for her to continue birth control until follow up appt. Educated pt that if bleeding increases to saturating 1 pad/hr she should go to the ED.  Pt expressed frustration that no one from our office has been in touch about results. Explained that we do not reach out unless there is an abnormal result. Discussed std testing from 9/22 and [place] results from 9/30 with pt.   Pt agrees to follow up at appt on 06/25/19 and has no further questions or concerns at this time.

## 2019-06-19 ENCOUNTER — Emergency Department (HOSPITAL_BASED_OUTPATIENT_CLINIC_OR_DEPARTMENT_OTHER)
Admission: EM | Admit: 2019-06-19 | Discharge: 2019-06-19 | Disposition: A | Discharge status: Home / Self Care | Payer: BC Managed Care – PPO | Attending: Emergency Medicine | Admitting: Emergency Medicine

## 2019-06-19 ENCOUNTER — Encounter (HOSPITAL_BASED_OUTPATIENT_CLINIC_OR_DEPARTMENT_OTHER): Payer: Self-pay | Admitting: *Deleted

## 2019-06-19 ENCOUNTER — Other Ambulatory Visit: Payer: Self-pay

## 2019-06-19 DIAGNOSIS — Z91040 Latex allergy status: Secondary | ICD-10-CM | POA: Insufficient documentation

## 2019-06-19 DIAGNOSIS — M545 Low back pain: Secondary | ICD-10-CM | POA: Insufficient documentation

## 2019-06-19 DIAGNOSIS — M542 Cervicalgia: Secondary | ICD-10-CM | POA: Diagnosis not present

## 2019-06-19 MED ORDER — METHOCARBAMOL 500 MG PO TABS: 500.0000 mg | ORAL_TABLET | Freq: Two times a day (BID) | ORAL | 0 refills | Status: DC

## 2019-06-19 MED ORDER — IBUPROFEN 800 MG PO TABS: 800.0000 mg | ORAL_TABLET | Freq: Three times a day (TID) | ORAL | 0 refills | Status: DC

## 2019-06-19 NOTE — Discharge Instructions (Addendum)
Please take medications, as prescribed.  You were given a prescription for Robaxin which is a muscle relaxer.  You should not drive, work, consume alcohol, or operate machinery while taking this medication as it can make you very drowsy.    Return to the ED or seek medical attention should you develop any headache, dizziness, chest pain, difficulty breathing, uncontrolled nausea vomiting, new or worsening abdominal discomfort, incontinence, neurologic deficits, or any other new or worsening symptoms.

## 2019-06-19 NOTE — ED Notes (Signed)
ED Provider at bedside. 
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
## 2019-06-25 ENCOUNTER — Encounter: Payer: Self-pay | Admitting: Family Medicine

## 2019-06-25 ENCOUNTER — Other Ambulatory Visit: Payer: Self-pay

## 2019-06-25 ENCOUNTER — Ambulatory Visit (INDEPENDENT_AMBULATORY_CARE_PROVIDER_SITE_OTHER): Payer: BC Managed Care – PPO | Admitting: Family Medicine

## 2019-06-25 VITALS — BP 123/82 | HR 84 | Wt 185.6 lb

## 2019-06-25 DIAGNOSIS — N939 Abnormal uterine and vaginal bleeding, unspecified: Secondary | ICD-10-CM

## 2019-06-25 NOTE — Progress Notes (Signed)
   Subjective:    Patient ID: Rachel Beck is a 23 y.o. female presenting with Vaginal Bleeding  on 06/25/2019  HPI: Menarche at age 78. Cycles were not normal. Cycles were irregular and skipped cycles until around age 72, then became monthly. Notes it is heavy and lasts 7 days. This year about 4 months ago, cycles started getting heavier and lasting longer. Notes 2 months ago, has had some bleeding which is continuing. Bleeds for most days. Started on the birth control for 2 months and cycles have lightened but bleeding continues.  Review of Systems  Constitutional: Negative for chills and fever.  Respiratory: Negative for shortness of breath.   Cardiovascular: Negative for chest pain.  Gastrointestinal: Negative for abdominal pain, nausea and vomiting.  Genitourinary: Negative for dysuria.  Skin: Negative for rash.      Objective:    BP 123/82   Pulse 84   Wt 185 lb 9.6 oz (84.2 kg)   LMP 06/19/2019   BMI 33.95 kg/m  Physical Exam Constitutional:      General: She is not in acute distress.    Appearance: She is well-developed.  HENT:     Head: Normocephalic and atraumatic.  Eyes:     General: No scleral icterus. Neck:     Musculoskeletal: Neck supple.  Cardiovascular:     Rate and Rhythm: Normal rate.  Pulmonary:     Effort: Pulmonary effort is normal.  Abdominal:     Palpations: Abdomen is soft.  Skin:    General: Skin is warm and dry.  Neurological:     Mental Status: She is alert and oriented to person, place, and time.         Assessment & Plan:   Problem List Items Addressed This Visit      Unprioritized   Abnormal vaginal bleeding - Primary    On OC's, does not want to conceive now and possibly ever. Given bleeding and u/s findings of thickened endometrium, maternal hx of "thin blood", will pursue w/u. Check TSH, VonWillebrand, sonohyst. Possible endometrial sampling if needed. Might be able to use IUD for contraception and bleeding control.  Discussed all of this. Return to discuss results.      Relevant Orders   TSH   [place] Sonohysterogram   Von Willebrand panel   US PELVIC COMPLETE WITH TRANSVAGINAL      Total face-to-face time with patient: 25 minutes. Over 50% of encounter was spent on counseling and coordination of care. Return in about 4 weeks (around 07/23/2019) for a follow-up--schedule sonohysterogram.  Donnamae Jude 06/25/2019 2:10 PM

## 2019-06-25 NOTE — Patient Instructions (Signed)

## 2019-06-25 NOTE — Progress Notes (Signed)
Sonohysterogram scheduled for November 18th @ 0740.  LM for pt that I am calling in regards to her [place] appt.  Appt and address location with contact # left on messge and if she has any questions to please give the office a call.   Mel Almond, RN 06/25/19

## 2019-06-25 NOTE — Assessment & Plan Note (Signed)
On OC's, does not want to conceive now and possibly ever. Given bleeding and u/s findings of thickened endometrium, maternal hx of "thin blood", will pursue w/u. Check TSH, VonWillebrand, sonohyst. Possible endometrial sampling if needed. Might be able to use IUD for contraception and bleeding control. Discussed all of this. Return to discuss results.

## 2019-06-27 LAB — COAG STUDIES INTERP REPORT

## 2019-06-27 LAB — VON WILLEBRAND PANEL
Factor VIII Activity: 167 % — ABNORMAL HIGH (ref 56–140)
Von Willebrand Ag: 98 % (ref 50–200)
Von Willebrand Factor: 129 % (ref 50–200)

## 2019-06-27 LAB — TSH: TSH: 1.53 u[IU]/mL (ref 0.450–4.500)

## 2019-07-09 ENCOUNTER — Other Ambulatory Visit: Payer: BC Managed Care – PPO

## 2019-07-16 ENCOUNTER — Other Ambulatory Visit: Payer: Self-pay

## 2019-07-21 ENCOUNTER — Ambulatory Visit: Payer: BLUE CROSS/BLUE SHIELD | Admitting: Women's Health

## 2019-07-24 ENCOUNTER — Ambulatory Visit
Admission: RE | Admit: 2019-07-24 | Discharge: 2019-07-24 | Disposition: A | Discharge status: Home / Self Care | Payer: BC Managed Care – PPO | Source: Ambulatory Visit | Attending: Family Medicine | Admitting: Family Medicine

## 2019-07-24 DIAGNOSIS — N8500 Endometrial hyperplasia, unspecified: Secondary | ICD-10-CM | POA: Diagnosis not present

## 2019-07-24 DIAGNOSIS — N939 Abnormal uterine and vaginal bleeding, unspecified: Secondary | ICD-10-CM | POA: Diagnosis not present

## 2019-07-26 ENCOUNTER — Other Ambulatory Visit: Payer: Self-pay

## 2019-07-26 DIAGNOSIS — Z20822 Contact with and (suspected) exposure to covid-19: Secondary | ICD-10-CM

## 2019-07-29 LAB — NOVEL CORONAVIRUS, NAA: SARS-CoV-2, NAA: NOT DETECTED

## 2019-07-31 ENCOUNTER — Telehealth: Payer: BC Managed Care – PPO | Admitting: Family Medicine

## 2019-07-31 DIAGNOSIS — Z5329 Procedure and treatment not carried out because of patient's decision for other reasons: Secondary | ICD-10-CM

## 2019-07-31 DIAGNOSIS — Z91199 Patient's noncompliance with other medical treatment and regimen due to unspecified reason: Secondary | ICD-10-CM

## 2019-07-31 NOTE — Progress Notes (Signed)
Patient did not keep appointment today due to conflict at work. She will call to reschedule.

## 2019-07-31 NOTE — Progress Notes (Signed)
I connected with  Rachel Beck on 07/31/19 at 10:35 AM EST by telephone and verified that I am speaking with the correct person using two identifiers.  Pt states she is at work and is unable to complete her visit at this time. Pt will call front office to reschedule appt.  Annabell Howells, RN 07/31/2019  10:28 AM

## 2019-08-25 ENCOUNTER — Ambulatory Visit: Payer: BLUE CROSS/BLUE SHIELD | Admitting: Women's Health

## 2019-08-25 DIAGNOSIS — Z0289 Encounter for other administrative examinations: Secondary | ICD-10-CM

## 2020-01-01 ENCOUNTER — Telehealth: Payer: Self-pay | Admitting: *Deleted

## 2020-01-01 NOTE — Telephone Encounter (Signed)
Pt left VM message stating that she needs refill of her birth control and only has 2 days left. In order to get the refill approved by her insurance, a form needs to be completed. Pt asked that we call [phone] in order to obtain the form. Pt requests a call back ASAP.

## 2020-01-02 ENCOUNTER — Telehealth (INDEPENDENT_AMBULATORY_CARE_PROVIDER_SITE_OTHER): Payer: BC Managed Care – PPO | Admitting: Lactation Services

## 2020-01-02 DIAGNOSIS — N939 Abnormal uterine and vaginal bleeding, unspecified: Secondary | ICD-10-CM

## 2020-01-02 NOTE — Telephone Encounter (Signed)
Called patient and she needs [place] to call [facility] to help with PA for [place]. She reports it is no longer covered by her insurance and that she is not wanting to change since this is working.   Reviewed with patient that she missed her last appointment with Dr. Kennon Rounds and she reports she would like to reschedule. Note to front office to call patient to reschedule.   Called [facility] and spoke with Avis. PA was approved based on the fact that she was taking previously. Confirmation # for approval [id number]. PA covers periods of  12/03/2019-01/01/2021  Per Pharmacy, patient has not refilled prescription since February. She report she gets the Pharmacy fills 3 months at a time and that she has taken it consistently since getting filled. Pharmacy was called and informed that prescription was approved.   Patient was called and informed that PA was approved and that she should be able to pick up the prescription later today. Patient voiced understanding.

## 2020-01-21 ENCOUNTER — Telehealth: Payer: Self-pay | Admitting: Family Medicine

## 2020-01-21 NOTE — Telephone Encounter (Signed)
Attempted to contact patient to get her scheduled w/ Dr. Kennon Rounds for her AUB. No answer, left voicemail for patient to give the offie a call back to be scheduled.

## 2020-04-07 ENCOUNTER — Ambulatory Visit (INDEPENDENT_AMBULATORY_CARE_PROVIDER_SITE_OTHER): Payer: BC Managed Care – PPO | Admitting: Family Medicine

## 2020-04-07 ENCOUNTER — Other Ambulatory Visit: Payer: Self-pay

## 2020-04-07 ENCOUNTER — Encounter: Payer: Self-pay | Admitting: Family Medicine

## 2020-04-07 VITALS — BP 115/69 | HR 87 | Ht 63.0 in | Wt 193.0 lb

## 2020-04-07 DIAGNOSIS — N939 Abnormal uterine and vaginal bleeding, unspecified: Secondary | ICD-10-CM | POA: Diagnosis not present

## 2020-04-07 DIAGNOSIS — N62 Hypertrophy of breast: Secondary | ICD-10-CM

## 2020-04-07 DIAGNOSIS — Z113 Encounter for screening for infections with a predominantly sexual mode of transmission: Secondary | ICD-10-CM | POA: Diagnosis not present

## 2020-04-07 MED ORDER — NORETHIN ACE-ETH ESTRAD-FE 1-20 MG-MCG(24) PO TABS: 1.0000 | ORAL_TABLET | Freq: Every day | ORAL | 11 refills | Status: DC

## 2020-04-07 NOTE — Assessment & Plan Note (Signed)
Change to low dose OC's that her insurance will cover.

## 2020-04-07 NOTE — Progress Notes (Signed)
° °  Subjective:    Patient ID: Rachel Beck is a 24 y.o. female presenting with Follow-up  on 04/07/2020  HPI: Has been having upper back pain and shoulder pain due to her large breasts. Has tried several different bras and nothing has helped.  Review of Systems  Constitutional: Negative for chills and fever.  Respiratory: Negative for shortness of breath.   Cardiovascular: Negative for chest pain.  Gastrointestinal: Negative for abdominal pain, nausea and vomiting.  Genitourinary: Negative for dysuria.  Skin: Negative for rash.      Objective:    BP 115/69    Pulse 87    Ht 5\' 3"  (1.6 m)    Wt 193 lb (87.5 kg)    LMP 03/31/2020 (Exact Date)    BMI 34.19 kg/m  Physical Exam Constitutional:      General: She is not in acute distress.    Appearance: She is well-developed.  HENT:     Head: Normocephalic and atraumatic.  Eyes:     General: No scleral icterus. Cardiovascular:     Rate and Rhythm: Normal rate.  Pulmonary:     Effort: Pulmonary effort is normal.  Abdominal:     Palpations: Abdomen is soft.  Musculoskeletal:     Cervical back: Neck supple.  Skin:    General: Skin is warm and dry.  Neurological:     Mental Status: She is alert and oriented to person, place, and time.         Assessment & Plan:   Problem List Items Addressed This Visit      Unprioritized   Abnormal vaginal bleeding - Primary    Change to low dose OC's that her insurance will cover.      Relevant Medications   Norethindrone Acetate-Ethinyl Estrad-FE (LOESTRIN 24 FE) 1-20 MG-MCG(24) tablet   Breast hypertrophy    Referral to plastic surgery for evaluation and possible breast reduction.      Relevant Orders   Ambulatory referral to Plastic Surgery    Other Visit Diagnoses    Screen for STD (sexually transmitted disease)       Relevant Orders   HIV Antibody (routine testing w rflx)   RPR   Hepatitis C Antibody   Hepatitis B Surface AntiGEN      Total time in review of  prior notes, pathology, labs, history taking, review with patient, exam, note writing, discussion of options, plan for next steps, alternatives and risks of treatment: 21 minutes.  Return in about 6 months (around 10/08/2020) for a CPE.  Donnamae Jude 04/07/2020 2:48 PM

## 2020-04-07 NOTE — Assessment & Plan Note (Signed)
Referral to plastic surgery for evaluation and possible breast reduction.

## 2020-04-08 LAB — HEPATITIS C ANTIBODY: Hep C Virus Ab: <0.1 s/co ratio (ref 0.0–0.9)

## 2020-04-08 LAB — HEPATITIS B SURFACE ANTIGEN: Hepatitis B Surface Ag: NEGATIVE

## 2020-04-08 LAB — HIV ANTIBODY (ROUTINE TESTING W REFLEX): HIV Screen 4th Generation wRfx: NONREACTIVE

## 2020-04-08 LAB — RPR: RPR Ser Ql: NONREACTIVE

## 2020-05-14 ENCOUNTER — Emergency Department (HOSPITAL_BASED_OUTPATIENT_CLINIC_OR_DEPARTMENT_OTHER): Payer: BC Managed Care – PPO

## 2020-05-14 ENCOUNTER — Other Ambulatory Visit: Payer: Self-pay

## 2020-05-14 ENCOUNTER — Encounter (HOSPITAL_BASED_OUTPATIENT_CLINIC_OR_DEPARTMENT_OTHER): Payer: Self-pay | Admitting: *Deleted

## 2020-05-14 ENCOUNTER — Emergency Department (HOSPITAL_BASED_OUTPATIENT_CLINIC_OR_DEPARTMENT_OTHER)
Admission: EM | Admit: 2020-05-14 | Discharge: 2020-05-14 | Disposition: A | Discharge status: Home / Self Care | Payer: BC Managed Care – PPO | Attending: Emergency Medicine | Admitting: Emergency Medicine

## 2020-05-14 DIAGNOSIS — U071 COVID-19: Secondary | ICD-10-CM | POA: Diagnosis not present

## 2020-05-14 DIAGNOSIS — R05 Cough: Secondary | ICD-10-CM | POA: Diagnosis not present

## 2020-05-14 LAB — RESPIRATORY PANEL BY RT PCR (FLU A&B, COVID)
Influenza A by PCR: NEGATIVE
Influenza B by PCR: NEGATIVE
SARS Coronavirus 2 by RT PCR: POSITIVE — AB

## 2020-05-14 MED ORDER — FLUTICASONE PROPIONATE 50 MCG/ACT NA SUSP: 2.0000 | Freq: Every day | NASAL | 0 refills | Status: DC

## 2020-05-14 MED ORDER — BENZONATATE 100 MG PO CAPS: 100.0000 mg | ORAL_CAPSULE | Freq: Three times a day (TID) | ORAL | 0 refills | Status: AC

## 2020-05-14 NOTE — ED Provider Notes (Signed)
[facility] EMERGENCY DEPARTMENT Provider Note   CSN: [id number] Arrival date & time: 05/14/20  1837     History Chief Complaint  Patient presents with  . covid symptoms    Rachel Beck is a 24 y.o. female.  HPI   Pt is a 24 y/o female who presents to the ED today for eval of loss of taste and smell for the last few days. She further reports increased fatigue and intermittent cough. Denies any persistent chest pain or shortness of breath. She reports reports prior headaches which have improved.   Past Medical History:  Diagnosis Date  . Allergy   . UTI (lower urinary tract infection)     Patient Active Problem List   Diagnosis Date Noted  . Breast hypertrophy 04/07/2020  . Abnormal vaginal bleeding 06/25/2019  . BMI 33.0-33.9,adult 05/13/2019  . Allergic reaction 05/28/2015  . Obese 05/28/2015    History reviewed. No pertinent surgical history.   OB History    Gravida  0   Para  0   Term  0   Preterm  0   AB  0   Living  0     SAB  0   TAB  0   Ectopic  0   Multiple  0   Live Births  0           Family History  Problem Relation Age of Onset  . Diabetes Father   . Seizures Sister   . Hypertension Paternal Grandmother   . Diabetes Paternal Grandmother   . Cancer Cousin        leukemia    Social History   Tobacco Use  . Smoking status: Never Smoker  . Smokeless tobacco: Never Used  Substance Use Topics  . Alcohol use: Yes    Comment: occasionally  . Drug use: No    Home Medications Prior to Admission medications   Medication Sig Start Date End Date Taking? Authorizing Provider  benzonatate (TESSALON) 100 MG capsule Take 1 capsule (100 mg total) by mouth every 8 (eight) hours for 5 days. 05/14/20 05/19/20  Alaa Mullally S, PA-C  fluticasone (FLONASE) 50 MCG/ACT nasal spray Place 2 sprays into both nostrils daily. 05/14/20   Manroop Jakubowicz S, PA-C  Norethindrone Acetate-Ethinyl Estrad-FE (LOESTRIN 24 FE) 1-20  MG-MCG(24) tablet Take 1 tablet by mouth daily. 04/07/20   Donnamae Jude, MD    Allergies    Latex  Review of Systems   Review of Systems  Constitutional: Positive for fatigue. Negative for fever.  HENT: Positive for congestion. Negative for ear pain and sore throat.   Eyes: Negative for pain and visual disturbance.  Respiratory: Negative for shortness of breath.   Cardiovascular: Negative for chest pain.  Gastrointestinal: Negative for abdominal pain, constipation, diarrhea, nausea and vomiting.  Genitourinary: Negative for dysuria and hematuria.  Musculoskeletal: Negative for back pain.  Skin: Negative for rash.  Neurological: Positive for headaches. Negative for seizures and syncope.  All other systems reviewed and are negative.   Physical Exam Updated Vital Signs BP (!) 129/91   Pulse 85   Temp 97.9 F (36.6 C) (Oral)   Resp 16   LMP 04/20/2020   SpO2 100%   Physical Exam Vitals and nursing note reviewed.  Constitutional:      General: She is not in acute distress.    Appearance: She is well-developed.  HENT:     Head: Normocephalic and atraumatic.  Eyes:     Conjunctiva/sclera:  Conjunctivae normal.  Cardiovascular:     Rate and Rhythm: Normal rate and regular rhythm.     Pulses: Normal pulses.     Heart sounds: Normal heart sounds. No murmur heard.   Pulmonary:     Effort: Pulmonary effort is normal. No respiratory distress.     Breath sounds: Normal breath sounds. No wheezing, rhonchi or rales.  Abdominal:     General: Bowel sounds are normal.     Palpations: Abdomen is soft.     Tenderness: There is no abdominal tenderness. There is no guarding or rebound.  Musculoskeletal:     Cervical back: Neck supple.  Skin:    General: Skin is warm and dry.  Neurological:     Mental Status: She is alert.     ED Results / Procedures / Treatments   Labs (all labs ordered are listed, but only abnormal results are displayed) Labs Reviewed  RESPIRATORY PANEL BY  RT PCR (FLU A&B, COVID) - Abnormal; Notable for the following components:      Result Value   SARS Coronavirus 2 by RT PCR POSITIVE (*)    All other components within normal limits    EKG None  Radiology DG Chest Portable 1 View  Result Date: 05/14/2020 CLINICAL DATA:  Cough EXAM: PORTABLE CHEST 1 VIEW COMPARISON:  12/28/2003 FINDINGS: The heart size and mediastinal contours are within normal limits. Both lungs are clear. The visualized skeletal structures are unremarkable. IMPRESSION: No active disease. Electronically Signed   By: Randa Ngo M.D.   On: 05/14/2020 21:51    Procedures Procedures (including critical care time)  Medications Ordered in ED Medications - No data to display  ED Course  I have reviewed the triage vital signs and the nursing notes.  Pertinent labs & imaging results that were available during my care of the patient were reviewed by me and considered in my medical decision making (see chart for details).    MDM Rules/Calculators/A&P                          Patient presenting for evaluation for Covid.  Reports symptoms ongoing for 1 week.  Patient nontoxic, well-appearing, no distress.  Vital signs are reassuring.  Chest x-ray No active disease. .  Tested for Covid in the ED. Results positive. Advised on quarantine measures. Will give Rx for symptomatic management. Advised on f/u and return precautions. Pt voiced understanding of the plan and reasons to return. All questions answered, pt stable for d/c.  Rachel Beck was evaluated in Emergency Department on 05/14/2020 for the symptoms described in the history of present illness. She was evaluated in the context of the global COVID-19 pandemic, which necessitated consideration that the patient might be at risk for infection with the SARS-CoV-2 virus that causes COVID-19. Institutional protocols and algorithms that pertain to the evaluation of patients at risk for COVID-19 are in a state of rapid change  based on information released by regulatory bodies including the CDC and federal and state organizations. These policies and algorithms were followed during the patient's care in the ED.   Final Clinical Impression(s) / ED Diagnoses Final diagnoses:  [id number]    Rx / DC Orders ED Discharge Orders         Ordered    benzonatate (TESSALON) 100 MG capsule  Every 8 hours        05/14/20 2129    fluticasone (FLONASE) 50 MCG/ACT nasal spray  Daily  05/14/20 2129           Rodney Booze, PA-C 05/14/20 2209    Truddie Hidden, MD 05/14/20 [phone]

## 2020-05-14 NOTE — Discharge Instructions (Addendum)
Please follow up with your primary care provider within 5-7 days for re-evaluation of your symptoms. If you do not have a primary care provider, information for a healthcare clinic has been provided for you to make arrangements for follow up care. Please return to the emergency department for any new or worsening symptoms. ° °

## 2020-05-14 NOTE — ED Triage Notes (Signed)
Pt c/o chills , cough, fatigue x 1 week

## 2020-05-15 ENCOUNTER — Telehealth (HOSPITAL_COMMUNITY): Payer: Self-pay | Admitting: Adult Health

## 2020-05-15 NOTE — Telephone Encounter (Signed)
Called patient regarding monoclonal antibody treatment for COVID 19 given to those who are at risk for complications and/or hospitalization of the virus.  Patient meets criteria based on: BMI greater than 25.  Rachel Beck's symptoms began on Monday, and she notes she has a cough that is worse today.  I reviewed in detail the benefits of treatment with her in detail and the risk reduction information.  After hearing about the monoclonal antibody treatment, she does not wish to receive this therapy.    Call back number given: [phone] if she changes her mind.  She understands that with her symptom onset of loss of taste and smell on 05/10/2020, her last day of treatment eligibility is 05/20/2020 and the treatment is more effective the earlier received in the course of viral illness.  Wished patient a speedy recovery.  Wilber Bihari, NP

## 2020-05-20 ENCOUNTER — Institutional Professional Consult (permissible substitution): Payer: BC Managed Care – PPO | Admitting: Plastic Surgery

## 2020-05-20 ENCOUNTER — Telehealth: Payer: Self-pay | Admitting: General Practice

## 2020-05-20 NOTE — Telephone Encounter (Signed)
Pt called per referral from [facility] for appt w/ [facility]. Unable to lvm

## 2020-06-23 ENCOUNTER — Institutional Professional Consult (permissible substitution): Payer: BC Managed Care – PPO | Admitting: Plastic Surgery

## 2020-08-25 ENCOUNTER — Other Ambulatory Visit: Payer: Self-pay

## 2020-08-25 ENCOUNTER — Encounter: Payer: Self-pay | Admitting: Plastic Surgery

## 2020-08-25 ENCOUNTER — Ambulatory Visit (INDEPENDENT_AMBULATORY_CARE_PROVIDER_SITE_OTHER): Payer: BC Managed Care – PPO | Admitting: Plastic Surgery

## 2020-08-25 VITALS — BP 110/74 | HR 89 | Temp 98.2°F | Ht 62.0 in | Wt 195.2 lb

## 2020-08-25 DIAGNOSIS — M545 Low back pain, unspecified: Secondary | ICD-10-CM

## 2020-08-25 DIAGNOSIS — M4004 Postural kyphosis, thoracic region: Secondary | ICD-10-CM

## 2020-08-25 DIAGNOSIS — M546 Pain in thoracic spine: Secondary | ICD-10-CM

## 2020-08-25 DIAGNOSIS — N62 Hypertrophy of breast: Secondary | ICD-10-CM | POA: Diagnosis not present
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
## 2020-10-02 ENCOUNTER — Encounter: Payer: Self-pay | Admitting: Plastic Surgery

## 2020-10-04 ENCOUNTER — Encounter (HOSPITAL_BASED_OUTPATIENT_CLINIC_OR_DEPARTMENT_OTHER): Payer: Self-pay | Admitting: Plastic Surgery

## 2020-10-04 ENCOUNTER — Other Ambulatory Visit: Payer: Self-pay

## 2020-10-05 ENCOUNTER — Encounter: Payer: Self-pay | Admitting: Plastic Surgery

## 2020-10-05 ENCOUNTER — Other Ambulatory Visit: Payer: Self-pay

## 2020-10-05 ENCOUNTER — Ambulatory Visit (INDEPENDENT_AMBULATORY_CARE_PROVIDER_SITE_OTHER): Payer: BC Managed Care – PPO | Admitting: Plastic Surgery

## 2020-10-05 VITALS — BP 123/88 | HR 81 | Ht 62.0 in | Wt 193.4 lb

## 2020-10-05 DIAGNOSIS — M545 Low back pain, unspecified: Secondary | ICD-10-CM

## 2020-10-05 DIAGNOSIS — M546 Pain in thoracic spine: Secondary | ICD-10-CM

## 2020-10-05 DIAGNOSIS — M4004 Postural kyphosis, thoracic region: Secondary | ICD-10-CM

## 2020-10-05 DIAGNOSIS — N62 Hypertrophy of breast: Secondary | ICD-10-CM

## 2020-10-05 MED ORDER — ACETAMINOPHEN 500 MG PO TABS: 500.0000 mg | ORAL_TABLET | Freq: Four times a day (QID) | ORAL | 0 refills | Status: DC | PRN

## 2020-10-05 MED ORDER — IBUPROFEN 600 MG PO TABS: 600.0000 mg | ORAL_TABLET | Freq: Four times a day (QID) | ORAL | 0 refills | Status: DC | PRN

## 2020-10-05 MED ORDER — HYDROCODONE-ACETAMINOPHEN 5-325 MG PO TABS: 1.0000 | ORAL_TABLET | Freq: Three times a day (TID) | ORAL | 0 refills | Status: AC | PRN

## 2020-10-05 MED ORDER — ONDANSETRON 4 MG PO TBDP: 4.0000 mg | ORAL_TABLET | Freq: Three times a day (TID) | ORAL | 0 refills | Status: DC | PRN

## 2020-10-05 NOTE — H&P (View-Only) (Signed)
ICD-10-CM   1. Macromastia  N62   2. Back pain of thoracolumbar region  M54.50    M54.6   3. Postural kyphosis, thoracic region  M40.04       Patient ID: Rachel Beck, female    DOB: Aug 01, 1996, 25 y.o.   MRN: [id number]   History of Present Illness: Rachel Beck is a 25 y.o.  female  with a history of macromastia.  She presents for preoperative evaluation for upcoming procedure, bilateral breast reduction, scheduled for 10/11/2020 with Dr. Claudia Desanctis.  Summary from previous visit: Patient has large breasts causing her back pain, neck pain, and shoulder grooving. She has never had a mammogram and never had any previous breast procedures.  She does not smoke and is not a diabetic.  She has no children and no plans of having any anytime soon.  She has grade 3 ptosis.  Sternal notch to nipple distance is 32 cm on the right and 33 cm on the left.  Nipple to IMF is 19 cm bilaterally.  No obvious scars or masses.  The estimated excess breast tissue to be removed at time of surgery is 600 g from each side.  She is currently a 70 H and would like to be a C or small D cup.  PMH Significant for: Allergies, Latex Allergy  The patient has not had problems with anesthesia.   Past Medical History: Allergies: Allergies  Allergen Reactions  . Latex     hives    Current Medications:  Current Outpatient Medications:  .  Norethindrone Acetate-Ethinyl Estrad-FE (LOESTRIN 24 FE) 1-20 MG-MCG(24) tablet, Take 1 tablet by mouth daily., Disp: 28 tablet, Rfl: 11  Past Medical Problems: Past Medical History:  Diagnosis Date  . Allergy   . UTI (lower urinary tract infection)     Past Surgical History: Past Surgical History:  Procedure Laterality Date  . WISDOM TOOTH EXTRACTION      Social History: Social History   Socioeconomic History  . Marital status: Single    Spouse name: Not on file  . Number of children: Not on file  . Years of education: Not on file  . Highest education  level: Not on file  Occupational History  . Not on file  Tobacco Use  . Smoking status: Never Smoker  . Smokeless tobacco: Never Used  Substance and Sexual Activity  . Alcohol use: Yes    Comment: occasionally  . Drug use: No  . Sexual activity: Yes    Birth control/protection: Pill  Other Topics Concern  . Not on file  Social History Narrative   Exercises regularly      Part time school, working   Social Determinants of Radio broadcast assistant Strain: Not on file  Food Insecurity: Not on file  Transportation Needs: Not on file  Physical Activity: Not on file  Stress: Not on file  Social Connections: Not on file  Intimate Partner Violence: Not on file    Family History: Family History  Problem Relation Age of Onset  . Diabetes Father   . Seizures Sister   . Hypertension Paternal Grandmother   . Diabetes Paternal Grandmother   . Cancer Cousin        leukemia    Review of Systems: Review of Systems  Constitutional: Negative for chills and fever.  HENT: Negative for congestion and sore throat.   Respiratory: Negative for cough and shortness of breath.   Cardiovascular: Negative for chest pain and  palpitations.  Gastrointestinal: Negative for abdominal pain, nausea and vomiting.  Musculoskeletal: Positive for back pain and neck pain.  Skin: Negative for itching and rash.    Physical Exam: Vital Signs BP 123/88 (BP Location: Left Arm, Patient Position: Sitting, Cuff Size: Large)   Pulse 81   Ht 5\' 2"  (1.575 m)   Wt 193 lb 6.4 oz (87.7 kg)   LMP 09/14/2020 (Exact Date)   SpO2 98%   BMI 35.37 kg/m  Physical Exam Vitals and nursing note reviewed.  Constitutional:      General: She is not in acute distress.    Appearance: Normal appearance. She is obese. She is not ill-appearing.  HENT:     Head: Normocephalic and atraumatic.  Eyes:     Extraocular Movements: Extraocular movements intact.  Cardiovascular:     Rate and Rhythm: Normal rate and regular  rhythm.     Pulses: Normal pulses.     Heart sounds: Normal heart sounds.  Pulmonary:     Effort: Pulmonary effort is normal.     Breath sounds: Normal breath sounds. No wheezing, rhonchi or rales.  Abdominal:     General: Bowel sounds are normal.     Palpations: Abdomen is soft.  Musculoskeletal:        General: No swelling. Normal range of motion.     Cervical back: Normal range of motion.  Skin:    General: Skin is warm and dry.     Coloration: Skin is not pale.     Findings: No erythema or rash.  Neurological:     General: No focal deficit present.     Mental Status: She is alert and oriented to person, place, and time.  Psychiatric:        Mood and Affect: Mood normal.        Behavior: Behavior normal.        Thought Content: Thought content normal.        Judgment: Judgment normal.     Assessment/Plan:  Ms. Chenier scheduled for bilateral breast reduction with Dr. Claudia Desanctis.  Risks, benefits, and alternatives of procedure discussed, questions answered and consent obtained.    Smoking Status: Non-smoker; Counseling Given?  N/A Last Mammogram: N/A-25 years old  Caprini Score: Low; Risk Factors include: 25 year old female, BMI > 25, and length of planned surgery. Recommendation for mechanical or pharmacological prophylaxis during surgery. Encourage early ambulation.   Pictures obtained: 08/25/2020  Post-op Rx sent to pharmacy: Norco, Zofran, ibuprofen, Tylenol  Patient was provided with the breast reduction risks and General Surgical Risk consent document and Pain Medication Agreement prior to their appointment.  They had adequate time to read through the risk consent documents and Pain Medication Agreement. We also discussed them in person together during this preop appointment. All of their questions were answered to their satisfaction.  Recommended calling if they have any further questions.  Risk consent form and Pain Medication Agreement to be scanned into patient's  chart.  The risk that can be encountered with breast reduction were discussed and include the following but not limited to these:  Breast asymmetry, fluid accumulation, firmness of the breast, inability to breast feed, loss of nipple or areola, skin loss, decrease or no nipple sensation, fat necrosis of the breast tissue, bleeding, infection, healing delay.  There are risks of anesthesia, changes to skin sensation and injury to nerves or blood vessels.  The muscle can be temporarily or permanently injured.  You may have an allergic reaction to tape,  suture, glue, blood products which can result in skin discoloration, swelling, pain, skin lesions, poor healing.  Any of these can lead to the need for revisonal surgery or stage procedures.  A reduction has potential to interfere with diagnostic procedures.  Nipple or breast piercing can increase risks of infection.  This procedure is best done when the breast is fully developed.  Changes in the breast will continue to occur over time.  Pregnancy can alter the outcomes of previous breast reduction surgery, weight gain and weigh loss can also effect the long term appearance.   Electronically signed by: Threasa Heads, PA-C 10/05/2020 2:53 PM

## 2020-10-05 NOTE — Progress Notes (Signed)
ICD-10-CM   1. Macromastia  N62   2. Back pain of thoracolumbar region  M54.50    M54.6   3. Postural kyphosis, thoracic region  M40.04       Patient ID: Rachel Beck, female    DOB: September 15, 1995, 25 y.o.   MRN: [id number]   History of Present Illness: Rachel Beck is a 25 y.o.  female  with a history of macromastia.  She presents for preoperative evaluation for upcoming procedure, bilateral breast reduction, scheduled for 10/11/2020 with Dr. Claudia Desanctis.  Summary from previous visit: Patient has large breasts causing her back pain, neck pain, and shoulder grooving. She has never had a mammogram and never had any previous breast procedures.  She does not smoke and is not a diabetic.  She has no children and no plans of having any anytime soon.  She has grade 3 ptosis.  Sternal notch to nipple distance is 32 cm on the right and 33 cm on the left.  Nipple to IMF is 19 cm bilaterally.  No obvious scars or masses.  The estimated excess breast tissue to be removed at time of surgery is 600 g from each side.  She is currently a 16 H and would like to be a C or small D cup.  PMH Significant for: Allergies, Latex Allergy  The patient has not had problems with anesthesia.   Past Medical History: Allergies: Allergies  Allergen Reactions  . Latex     hives    Current Medications:  Current Outpatient Medications:  .  Norethindrone Acetate-Ethinyl Estrad-FE (LOESTRIN 24 FE) 1-20 MG-MCG(24) tablet, Take 1 tablet by mouth daily., Disp: 28 tablet, Rfl: 11  Past Medical Problems: Past Medical History:  Diagnosis Date  . Allergy   . UTI (lower urinary tract infection)     Past Surgical History: Past Surgical History:  Procedure Laterality Date  . WISDOM TOOTH EXTRACTION      Social History: Social History   Socioeconomic History  . Marital status: Single    Spouse name: Not on file  . Number of children: Not on file  . Years of education: Not on file  . Highest education  level: Not on file  Occupational History  . Not on file  Tobacco Use  . Smoking status: Never Smoker  . Smokeless tobacco: Never Used  Substance and Sexual Activity  . Alcohol use: Yes    Comment: occasionally  . Drug use: No  . Sexual activity: Yes    Birth control/protection: Pill  Other Topics Concern  . Not on file  Social History Narrative   Exercises regularly      Part time school, working   Social Determinants of Radio broadcast assistant Strain: Not on file  Food Insecurity: Not on file  Transportation Needs: Not on file  Physical Activity: Not on file  Stress: Not on file  Social Connections: Not on file  Intimate Partner Violence: Not on file    Family History: Family History  Problem Relation Age of Onset  . Diabetes Father   . Seizures Sister   . Hypertension Paternal Grandmother   . Diabetes Paternal Grandmother   . Cancer Cousin        leukemia    Review of Systems: Review of Systems  Constitutional: Negative for chills and fever.  HENT: Negative for congestion and sore throat.   Respiratory: Negative for cough and shortness of breath.   Cardiovascular: Negative for chest pain and  palpitations.  Gastrointestinal: Negative for abdominal pain, nausea and vomiting.  Musculoskeletal: Positive for back pain and neck pain.  Skin: Negative for itching and rash.    Physical Exam: Vital Signs BP 123/88 (BP Location: Left Arm, Patient Position: Sitting, Cuff Size: Large)   Pulse 81   Ht 5\' 2"  (1.575 m)   Wt 193 lb 6.4 oz (87.7 kg)   LMP 09/14/2020 (Exact Date)   SpO2 98%   BMI 35.37 kg/m  Physical Exam Vitals and nursing note reviewed.  Constitutional:      General: She is not in acute distress.    Appearance: Normal appearance. She is obese. She is not ill-appearing.  HENT:     Head: Normocephalic and atraumatic.  Eyes:     Extraocular Movements: Extraocular movements intact.  Cardiovascular:     Rate and Rhythm: Normal rate and regular  rhythm.     Pulses: Normal pulses.     Heart sounds: Normal heart sounds.  Pulmonary:     Effort: Pulmonary effort is normal.     Breath sounds: Normal breath sounds. No wheezing, rhonchi or rales.  Abdominal:     General: Bowel sounds are normal.     Palpations: Abdomen is soft.  Musculoskeletal:        General: No swelling. Normal range of motion.     Cervical back: Normal range of motion.  Skin:    General: Skin is warm and dry.     Coloration: Skin is not pale.     Findings: No erythema or rash.  Neurological:     General: No focal deficit present.     Mental Status: She is alert and oriented to person, place, and time.  Psychiatric:        Mood and Affect: Mood normal.        Behavior: Behavior normal.        Thought Content: Thought content normal.        Judgment: Judgment normal.     Assessment/Plan:  Ms. Fielder scheduled for bilateral breast reduction with Dr. Claudia Desanctis.  Risks, benefits, and alternatives of procedure discussed, questions answered and consent obtained.    Smoking Status: Non-smoker; Counseling Given?  N/A Last Mammogram: N/A-25 years old  Caprini Score: Low; Risk Factors include: 25 year old female, BMI > 25, and length of planned surgery. Recommendation for mechanical or pharmacological prophylaxis during surgery. Encourage early ambulation.   Pictures obtained: 08/25/2020  Post-op Rx sent to pharmacy: Norco, Zofran, ibuprofen, Tylenol  Patient was provided with the breast reduction risks and General Surgical Risk consent document and Pain Medication Agreement prior to their appointment.  They had adequate time to read through the risk consent documents and Pain Medication Agreement. We also discussed them in person together during this preop appointment. All of their questions were answered to their satisfaction.  Recommended calling if they have any further questions.  Risk consent form and Pain Medication Agreement to be scanned into patient's  chart.  The risk that can be encountered with breast reduction were discussed and include the following but not limited to these:  Breast asymmetry, fluid accumulation, firmness of the breast, inability to breast feed, loss of nipple or areola, skin loss, decrease or no nipple sensation, fat necrosis of the breast tissue, bleeding, infection, healing delay.  There are risks of anesthesia, changes to skin sensation and injury to nerves or blood vessels.  The muscle can be temporarily or permanently injured.  You may have an allergic reaction to tape,  suture, glue, blood products which can result in skin discoloration, swelling, pain, skin lesions, poor healing.  Any of these can lead to the need for revisonal surgery or stage procedures.  A reduction has potential to interfere with diagnostic procedures.  Nipple or breast piercing can increase risks of infection.  This procedure is best done when the breast is fully developed.  Changes in the breast will continue to occur over time.  Pregnancy can alter the outcomes of previous breast reduction surgery, weight gain and weigh loss can also effect the long term appearance.   Electronically signed by: Threasa Heads, PA-C 10/05/2020 2:53 PM

## 2020-10-06 ENCOUNTER — Encounter: Payer: BC Managed Care – PPO | Admitting: Surgical

## 2020-10-08 ENCOUNTER — Other Ambulatory Visit (HOSPITAL_COMMUNITY)
Admission: RE | Admit: 2020-10-08 | Discharge: 2020-10-08 | Disposition: A | Discharge status: Home / Self Care | Payer: BC Managed Care – PPO | Source: Ambulatory Visit | Attending: Plastic Surgery | Admitting: Plastic Surgery

## 2020-10-08 DIAGNOSIS — N62 Hypertrophy of breast: Secondary | ICD-10-CM | POA: Diagnosis not present

## 2020-10-08 DIAGNOSIS — M546 Pain in thoracic spine: Secondary | ICD-10-CM | POA: Diagnosis not present

## 2020-10-08 DIAGNOSIS — D242 Benign neoplasm of left breast: Secondary | ICD-10-CM | POA: Diagnosis not present

## 2020-10-08 DIAGNOSIS — Z20822 Contact with and (suspected) exposure to covid-19: Secondary | ICD-10-CM | POA: Insufficient documentation

## 2020-10-08 DIAGNOSIS — M4004 Postural kyphosis, thoracic region: Secondary | ICD-10-CM | POA: Diagnosis not present

## 2020-10-08 DIAGNOSIS — Z91040 Latex allergy status: Secondary | ICD-10-CM | POA: Diagnosis not present

## 2020-10-08 DIAGNOSIS — Z807 Family history of other malignant neoplasms of lymphoid, hematopoietic and related tissues: Secondary | ICD-10-CM | POA: Diagnosis not present

## 2020-10-08 DIAGNOSIS — Z01812 Encounter for preprocedural laboratory examination: Secondary | ICD-10-CM | POA: Insufficient documentation

## 2020-10-08 DIAGNOSIS — D241 Benign neoplasm of right breast: Secondary | ICD-10-CM | POA: Diagnosis not present

## 2020-10-08 DIAGNOSIS — M545 Low back pain, unspecified: Secondary | ICD-10-CM | POA: Diagnosis not present

## 2020-10-09 LAB — SARS CORONAVIRUS 2 (TAT 6-24 HRS): SARS Coronavirus 2: NEGATIVE

## 2020-10-11 ENCOUNTER — Encounter (HOSPITAL_BASED_OUTPATIENT_CLINIC_OR_DEPARTMENT_OTHER): Payer: Self-pay | Admitting: Plastic Surgery

## 2020-10-11 ENCOUNTER — Other Ambulatory Visit: Payer: Self-pay

## 2020-10-11 ENCOUNTER — Ambulatory Visit (HOSPITAL_BASED_OUTPATIENT_CLINIC_OR_DEPARTMENT_OTHER): Payer: BC Managed Care – PPO | Admitting: Certified Registered"

## 2020-10-11 ENCOUNTER — Encounter (HOSPITAL_BASED_OUTPATIENT_CLINIC_OR_DEPARTMENT_OTHER)
Admission: RE | Disposition: A | Discharge status: Home / Self Care | Payer: Self-pay | Source: Home / Self Care | Attending: Plastic Surgery

## 2020-10-11 ENCOUNTER — Ambulatory Visit (HOSPITAL_BASED_OUTPATIENT_CLINIC_OR_DEPARTMENT_OTHER)
Admission: RE | Admit: 2020-10-11 | Discharge: 2020-10-11 | Disposition: A | Discharge status: Home / Self Care | Payer: BC Managed Care – PPO | Attending: Plastic Surgery | Admitting: Plastic Surgery

## 2020-10-11 DIAGNOSIS — Z91040 Latex allergy status: Secondary | ICD-10-CM | POA: Insufficient documentation

## 2020-10-11 DIAGNOSIS — N939 Abnormal uterine and vaginal bleeding, unspecified: Secondary | ICD-10-CM | POA: Diagnosis not present

## 2020-10-11 DIAGNOSIS — M4004 Postural kyphosis, thoracic region: Secondary | ICD-10-CM | POA: Diagnosis not present

## 2020-10-11 DIAGNOSIS — D241 Benign neoplasm of right breast: Secondary | ICD-10-CM | POA: Diagnosis not present

## 2020-10-11 DIAGNOSIS — Z20822 Contact with and (suspected) exposure to covid-19: Secondary | ICD-10-CM | POA: Diagnosis not present

## 2020-10-11 DIAGNOSIS — Z807 Family history of other malignant neoplasms of lymphoid, hematopoietic and related tissues: Secondary | ICD-10-CM | POA: Insufficient documentation

## 2020-10-11 DIAGNOSIS — N62 Hypertrophy of breast: Secondary | ICD-10-CM | POA: Insufficient documentation

## 2020-10-11 DIAGNOSIS — D242 Benign neoplasm of left breast: Secondary | ICD-10-CM | POA: Insufficient documentation

## 2020-10-11 DIAGNOSIS — M546 Pain in thoracic spine: Secondary | ICD-10-CM | POA: Diagnosis not present

## 2020-10-11 DIAGNOSIS — M545 Low back pain, unspecified: Secondary | ICD-10-CM | POA: Diagnosis not present

## 2020-10-11 DIAGNOSIS — N63 Unspecified lump in unspecified breast: Secondary | ICD-10-CM | POA: Diagnosis not present

## 2020-10-11 DIAGNOSIS — N390 Urinary tract infection, site not specified: Secondary | ICD-10-CM | POA: Diagnosis not present

## 2020-10-11 HISTORY — PX: BREAST REDUCTION SURGERY: SHX8

## 2020-10-11 LAB — POCT PREGNANCY, URINE: Preg Test, Ur: NEGATIVE

## 2020-10-11 SURGERY — MAMMOPLASTY, REDUCTION
Anesthesia: General | Site: Breast | Laterality: Bilateral

## 2020-10-11 MED ORDER — FENTANYL CITRATE (PF) 100 MCG/2ML IJ SOLN
INTRAMUSCULAR | Status: DC | PRN
  Administered 2020-10-11: 50 ug via INTRAVENOUS
  Administered 2020-10-11: 100 ug via INTRAVENOUS
  Administered 2020-10-11: 50 ug via INTRAVENOUS

## 2020-10-11 MED ORDER — PHENYLEPHRINE HCL (PRESSORS) 10 MG/ML IV SOLN
INTRAVENOUS | Status: DC | PRN
  Administered 2020-10-11 (×2): 40 ug via INTRAVENOUS

## 2020-10-11 MED ORDER — ROCURONIUM BROMIDE 100 MG/10ML IV SOLN
INTRAVENOUS | Status: DC | PRN
  Administered 2020-10-11: 100 mg via INTRAVENOUS

## 2020-10-11 MED ORDER — SUGAMMADEX SODIUM 200 MG/2ML IV SOLN
INTRAVENOUS | Status: DC | PRN
  Administered 2020-10-11: 172.8 mg via INTRAVENOUS
  Administered 2020-10-11: 60 mg via INTRAVENOUS
  Administered 2020-10-11: 172.8 mg via INTRAVENOUS

## 2020-10-11 MED ORDER — FENTANYL CITRATE (PF) 100 MCG/2ML IJ SOLN
INTRAMUSCULAR | Status: AC
  Filled 2020-10-11: qty 2

## 2020-10-11 MED ORDER — PROMETHAZINE HCL 25 MG/ML IJ SOLN: 6.2500 mg | INTRAMUSCULAR | Status: DC | PRN

## 2020-10-11 MED ORDER — AMISULPRIDE (ANTIEMETIC) 5 MG/2ML IV SOLN: 10.0000 mg | Freq: Once | INTRAVENOUS | Status: DC | PRN

## 2020-10-11 MED ORDER — OXYCODONE HCL 5 MG/5ML PO SOLN: 5.0000 mg | Freq: Once | ORAL | Status: DC | PRN

## 2020-10-11 MED ORDER — PROPOFOL 10 MG/ML IV BOLUS
INTRAVENOUS | Status: DC | PRN
Start: 2020-10-11 — End: 2020-10-11
  Administered 2020-10-11: 25 ug/kg/min via INTRAVENOUS
  Administered 2020-10-11: 200 mg via INTRAVENOUS

## 2020-10-11 MED ORDER — ONDANSETRON HCL 4 MG/2ML IJ SOLN
INTRAMUSCULAR | Status: DC | PRN
  Administered 2020-10-11: 4 mg via INTRAVENOUS

## 2020-10-11 MED ORDER — LACTATED RINGERS IV SOLN: INTRAVENOUS | Status: DC

## 2020-10-11 MED ORDER — MEPERIDINE HCL 25 MG/ML IJ SOLN
6.2500 mg | INTRAMUSCULAR | Status: DC | PRN
Start: 2020-10-11 — End: 2020-10-11
  Administered 2020-10-11: 6.25 mg via INTRAVENOUS

## 2020-10-11 MED ORDER — DEXAMETHASONE SODIUM PHOSPHATE 4 MG/ML IJ SOLN
INTRAMUSCULAR | Status: DC | PRN
  Administered 2020-10-11: 8 mg via INTRAVENOUS

## 2020-10-11 MED ORDER — CEFAZOLIN SODIUM-DEXTROSE 2-4 GM/100ML-% IV SOLN
2.0000 g | INTRAVENOUS | Status: AC
  Administered 2020-10-11: 2 g via INTRAVENOUS

## 2020-10-11 MED ORDER — 0.9 % SODIUM CHLORIDE (POUR BTL) OPTIME
TOPICAL | Status: DC | PRN
  Administered 2020-10-11: 1000 mL

## 2020-10-11 MED ORDER — CEFAZOLIN SODIUM-DEXTROSE 2-4 GM/100ML-% IV SOLN
INTRAVENOUS | Status: AC
  Filled 2020-10-11: qty 100

## 2020-10-11 MED ORDER — HYDROMORPHONE HCL 1 MG/ML IJ SOLN: 0.2500 mg | INTRAMUSCULAR | Status: DC | PRN

## 2020-10-11 MED ORDER — LACTATED RINGERS IV SOLN
INTRAVENOUS | Status: DC | PRN
  Administered 2020-10-11: 2000 mL

## 2020-10-11 MED ORDER — LIDOCAINE HCL (CARDIAC) PF 100 MG/5ML IV SOSY
PREFILLED_SYRINGE | INTRAVENOUS | Status: DC | PRN
  Administered 2020-10-11: 30 mg via INTRAVENOUS

## 2020-10-11 MED ORDER — OXYCODONE HCL 5 MG PO TABS: 5.0000 mg | ORAL_TABLET | Freq: Once | ORAL | Status: DC | PRN

## 2020-10-11 MED ORDER — MIDAZOLAM HCL 5 MG/5ML IJ SOLN
INTRAMUSCULAR | Status: DC | PRN
  Administered 2020-10-11: 2 mg via INTRAVENOUS

## 2020-10-11 MED ORDER — MEPERIDINE HCL 25 MG/ML IJ SOLN
INTRAMUSCULAR | Status: AC
  Filled 2020-10-11: qty 1

## 2020-10-11 MED ORDER — MIDAZOLAM HCL 2 MG/2ML IJ SOLN
INTRAMUSCULAR | Status: AC
  Filled 2020-10-11: qty 2

## 2020-10-11 SURGICAL SUPPLY — 69 items
BAG DECANTER FOR FLEXI CONT (MISCELLANEOUS) IMPLANT
BENZOIN TINCTURE PRP APPL 2/3 (GAUZE/BANDAGES/DRESSINGS) ×4 IMPLANT
BLADE SURG 10 STRL SS (BLADE) ×4 IMPLANT
BLADE SURG 15 STRL LF DISP TIS (BLADE) IMPLANT
BLADE SURG 15 STRL SS (BLADE)
BNDG ELASTIC 6X5.8 VLCR STR LF (GAUZE/BANDAGES/DRESSINGS) ×4 IMPLANT
CANISTER SUCT 1200ML W/VALVE (MISCELLANEOUS) ×2 IMPLANT
CHLORAPREP W/TINT 26 (MISCELLANEOUS) ×4 IMPLANT
CLIP VESOCCLUDE MED 6/CT (CLIP) IMPLANT
COVER BACK TABLE 60X90IN (DRAPES) ×2 IMPLANT
COVER MAYO STAND STRL (DRAPES) ×2 IMPLANT
COVER WAND RF STERILE (DRAPES) IMPLANT
DECANTER SPIKE VIAL GLASS SM (MISCELLANEOUS) IMPLANT
DRAIN CHANNEL 15F RND FF W/TCR (WOUND CARE) IMPLANT
DRAIN PENROSE 1/2X12 LTX STRL (WOUND CARE) IMPLANT
DRAPE LAPAROSCOPIC ABDOMINAL (DRAPES) ×2 IMPLANT
DRAPE UTILITY XL STRL (DRAPES) ×2 IMPLANT
DRSG PAD ABDOMINAL 8X10 ST (GAUZE/BANDAGES/DRESSINGS) ×4 IMPLANT
ELECT REM PT RETURN 9FT ADLT (ELECTROSURGICAL) ×2
ELECTRODE REM PT RTRN 9FT ADLT (ELECTROSURGICAL) ×1 IMPLANT
EVACUATOR SILICONE 100CC (DRAIN) IMPLANT
GAUZE SPONGE 4X4 12PLY STRL (GAUZE/BANDAGES/DRESSINGS) ×4 IMPLANT
GAUZE XEROFORM 5X9 LF (GAUZE/BANDAGES/DRESSINGS) IMPLANT
GLOVE SRG 8 PF TXTR STRL LF DI (GLOVE) ×3 IMPLANT
GLOVE SURG ENC MOIS LTX SZ6.5 (GLOVE) IMPLANT
GLOVE SURG ENC MOIS LTX SZ7.5 (GLOVE) IMPLANT
GLOVE SURG ENC TEXT LTX SZ7.5 (GLOVE) IMPLANT
GLOVE SURG LTX SZ6.5 (GLOVE) IMPLANT
GLOVE SURG POLYISO LF SZ6.5 (GLOVE) ×2 IMPLANT
GLOVE SURG SS PI 7.5 STRL IVOR (GLOVE) ×4 IMPLANT
GLOVE SURG UNDER POLY LF SZ7 (GLOVE) ×4 IMPLANT
GLOVE SURG UNDER POLY LF SZ8 (GLOVE) ×6
GOWN STRL REUS W/ TWL LRG LVL3 (GOWN DISPOSABLE) ×3 IMPLANT
GOWN STRL REUS W/TWL LRG LVL3 (GOWN DISPOSABLE) ×6
MARKER SKIN DUAL TIP RULER LAB (MISCELLANEOUS) IMPLANT
NDL SAFETY ECLIPSE 18X1.5 (NEEDLE) ×2 IMPLANT
NEEDLE FILTER BLUNT 18X 1/2SAF (NEEDLE) ×1
NEEDLE FILTER BLUNT 18X1 1/2 (NEEDLE) ×1 IMPLANT
NEEDLE HYPO 18GX1.5 SHARP (NEEDLE) ×4
NEEDLE HYPO 25X1 1.5 SAFETY (NEEDLE) IMPLANT
NEEDLE SPNL 18GX3.5 QUINCKE PK (NEEDLE) ×2 IMPLANT
NS IRRIG 1000ML POUR BTL (IV SOLUTION) ×2 IMPLANT
PACK BASIN DAY SURGERY FS (CUSTOM PROCEDURE TRAY) ×2 IMPLANT
PENCIL SMOKE EVACUATOR (MISCELLANEOUS) ×2 IMPLANT
PIN SAFETY STERILE (MISCELLANEOUS) IMPLANT
SHEET MEDIUM DRAPE 40X70 STRL (DRAPES) IMPLANT
SLEEVE SCD COMPRESS KNEE MED (MISCELLANEOUS) ×2 IMPLANT
SPONGE LAP 18X18 RF (DISPOSABLE) ×8 IMPLANT
STAPLER INSORB 30 2030 C-SECTI (MISCELLANEOUS) ×4 IMPLANT
STAPLER VISISTAT 35W (STAPLE) ×4 IMPLANT
STRIP SUTURE WOUND CLOSURE 1/2 (MISCELLANEOUS) ×6 IMPLANT
SUT CHROMIC 4 0 PS 2 18 (SUTURE) IMPLANT
SUT ETHILON 2 0 FS 18 (SUTURE) IMPLANT
SUT ETHILON 3 0 PS 1 (SUTURE) IMPLANT
SUT MNCRL AB 4-0 PS2 18 (SUTURE) ×6 IMPLANT
SUT PDS 3-0 CT2 (SUTURE) ×6
SUT PDS II 3-0 CT2 27 ABS (SUTURE) ×3 IMPLANT
SUT VIC AB 3-0 PS1 18 (SUTURE)
SUT VIC AB 3-0 PS1 18XBRD (SUTURE) IMPLANT
SUT VLOC 180 P-14 24 (SUTURE) ×4 IMPLANT
SYR 50ML LL SCALE MARK (SYRINGE) ×2 IMPLANT
SYR BULB IRRIG 60ML STRL (SYRINGE) ×2 IMPLANT
SYR CONTROL 10ML LL (SYRINGE) IMPLANT
TAPE MEASURE VINYL STERILE (MISCELLANEOUS) IMPLANT
TOWEL GREEN STERILE FF (TOWEL DISPOSABLE) ×4 IMPLANT
TUBE CONNECTING 20X1/4 (TUBING) ×2 IMPLANT
TUBING INFILTRATION IT-10001 (TUBING) ×2 IMPLANT
UNDERPAD 30X36 HEAVY ABSORB (UNDERPADS AND DIAPERS) ×4 IMPLANT
YANKAUER SUCT BULB TIP NO VENT (SUCTIONS) ×2 IMPLANT

## 2020-10-11 NOTE — Discharge Instructions (Addendum)
Activity As tolerated: NO showers for 3 days No heavy activities  Diet: Regular  Wound Care: Keep dressing clean & dry for 3 days.  Keep wrap applied with compression as much as possible.  You may have some drainage from your breast, this is normal.  Do not change dressings for 3 days unless soiled.  Can change if needed but make sure to reapply wrap. After three days can remove wrap and shower.  Then reapply dressings if needed and continue compression with wrap or soft sports bra. Call doctor if any unusual problems occur such as pain, excessive bleeding, unrelieved nausea/vomiting, fever &/or chills  Follow-up appointment: Scheduled for next week.   Post Anesthesia Home Care Instructions  Activity: Get plenty of rest for the remainder of the day. A responsible individual must stay with you for 24 hours following the procedure.  For the next 24 hours, DO NOT: -Drive a car -Paediatric nurse -Drink alcoholic beverages -Take any medication unless instructed by your physician -Make any legal decisions or sign important papers.  Meals: Start with liquid foods such as gelatin or soup. Progress to regular foods as tolerated. Avoid greasy, spicy, heavy foods. If nausea and/or vomiting occur, drink only clear liquids until the nausea and/or vomiting subsides. Call your physician if vomiting continues.  Special Instructions/Symptoms: Your throat may feel dry or sore from the anesthesia or the breathing tube placed in your throat during surgery. If this causes discomfort, gargle with warm salt water. The discomfort should disappear within 24 hours.  If you had a scopolamine patch placed behind your ear for the management of post- operative nausea and/or vomiting:  1. The medication in the patch is effective for 72 hours, after which it should be removed.  Wrap patch in a tissue and discard in the trash. Wash hands thoroughly with soap and water. 2. You may remove the patch earlier than 72  hours if you experience unpleasant side effects which may include dry mouth, dizziness or visual disturbances. 3. Avoid touching the patch. Wash your hands with soap and water after contact with the patch.

## 2020-10-11 NOTE — Anesthesia Preprocedure Evaluation (Signed)
Anesthesia Evaluation  Patient identified by MRN, date of birth, ID band Patient awake    Reviewed: Allergy & Precautions, H&P , NPO status , Patient's Chart, lab work & pertinent test results  Airway Mallampati: II  TM Distance: >3 FB Neck ROM: Full    Dental no notable dental hx.    Pulmonary neg pulmonary ROS,    Pulmonary exam normal breath sounds clear to auscultation       Cardiovascular negative cardio ROS Normal cardiovascular exam Rhythm:Regular Rate:Normal     Neuro/Psych negative neurological ROS  negative psych ROS   GI/Hepatic negative GI ROS, Neg liver ROS,   Endo/Other  negative endocrine ROS  Renal/GU negative Renal ROS  negative genitourinary   Musculoskeletal negative musculoskeletal ROS (+)   Abdominal (+) + obese,   Peds negative pediatric ROS (+)  Hematology negative hematology ROS (+)   Anesthesia Other Findings   Reproductive/Obstetrics negative OB ROS                             Anesthesia Physical Anesthesia Plan  ASA: II  Anesthesia Plan: General   Post-op Pain Management:    Induction: Intravenous  PONV Risk Score and Plan: 3 and Ondansetron, Dexamethasone, Midazolam and Treatment may vary due to age or medical condition  Airway Management Planned: Oral ETT  Additional Equipment:   Intra-op Plan:   Post-operative Plan: Extubation in OR  Informed Consent: I have reviewed the patients History and Physical, chart, labs and discussed the procedure including the risks, benefits and alternatives for the proposed anesthesia with the patient or authorized representative who has indicated his/her understanding and acceptance.     Dental advisory given  Plan Discussed with: CRNA  Anesthesia Plan Comments:         Anesthesia Quick Evaluation

## 2020-10-11 NOTE — Anesthesia Procedure Notes (Signed)
Procedure Name: Intubation Date/Time: 10/11/2020 10:22 AM Performed by: Ezequiel Kayser, CRNA Pre-anesthesia Checklist: Patient identified, Emergency Drugs available, Suction available and Patient being monitored Patient Re-evaluated:Patient Re-evaluated prior to induction Oxygen Delivery Method: Circle System Utilized Preoxygenation: Pre-oxygenation with 100% oxygen Induction Type: IV induction Ventilation: Mask ventilation without difficulty Laryngoscope Size: Mac and 3 Grade View: Grade I Tube type: Oral Tube size: 7.0 mm Number of attempts: 1 Airway Equipment and Method: Stylet and Oral airway Placement Confirmation: ETT inserted through vocal cords under direct vision,  positive ETCO2 and breath sounds checked- equal and bilateral Secured at: 21 cm Tube secured with: Tape Dental Injury: Teeth and Oropharynx as per pre-operative assessment  Comments: Eyes taped prior to mask ventilation

## 2020-10-11 NOTE — Transfer of Care (Signed)
Immediate Anesthesia Transfer of Care Note  Patient: Rachel Beck  Procedure(s) Performed: MAMMARY REDUCTION  (BREAST) (Bilateral Breast)  Patient Location: PACU  Anesthesia Type:General  Level of Consciousness: drowsy  Airway & Oxygen Therapy: Patient Spontanous Breathing and Patient connected to face mask oxygen  Post-op Assessment: Report given to RN and Post -op Vital signs reviewed and stable  Post vital signs: Reviewed and stable  Last Vitals:  Vitals Value Taken Time  BP  10/11/20 1224  Temp    Pulse 85 10/11/20 1226  Resp 26 10/11/20 1226  SpO2 98 % 10/11/20 1226  Vitals shown include unvalidated device data.  Last Pain:  Vitals:   10/11/20 0927  TempSrc: Oral  PainSc: 0-No pain         Complications: No complications documented.

## 2020-10-11 NOTE — Anesthesia Postprocedure Evaluation (Signed)
Anesthesia Post Note  Patient: Rachel Beck  Procedure(s) Performed: MAMMARY REDUCTION  (BREAST) (Bilateral Breast)     Patient location during evaluation: PACU Anesthesia Type: General Level of consciousness: awake and alert Pain management: pain level controlled Vital Signs Assessment: post-procedure vital signs reviewed and stable Respiratory status: spontaneous breathing, nonlabored ventilation and respiratory function stable Cardiovascular status: blood pressure returned to baseline and stable Postop Assessment: no apparent nausea or vomiting Anesthetic complications: no   No complications documented.  Last Vitals:  Vitals:   10/11/20 1245 10/11/20 1255  BP: 113/77 113/74  Pulse: 78 76  Resp: (!) 21 16  Temp:    SpO2: 99% 100%    Last Pain:  Vitals:   10/11/20 1255  TempSrc:   PainSc: [place]

## 2020-10-11 NOTE — Op Note (Signed)
Operative Note   DATE OF OPERATION: 10/11/2020  LOCATION: [facility]   SURGICAL DEPARTMENT: Plastic Surgery  PREOPERATIVE DIAGNOSES: Bilateral symptomatic macromastia.  POSTOPERATIVE DIAGNOSES:  same  PROCEDURE: Bilateral breast reduction with superomedial pedicle.  SURGEON: Talmadge Coventry, MD  ASSISTANT: Verdie Shire, PA The advanced practice practitioner (APP) assisted throughout the case.  The APP was essential in retraction and counter traction when needed to make the case progress smoothly.  This retraction and assistance made it possible to see the tissue plans for the procedure.  The assistance was needed for blood control, tissue re-approximation and assisted with closure of the incision site.  ANESTHESIA: General.  COMPLICATIONS: None.   INDICATIONS FOR PROCEDURE:  The patient, Rachel Beck is a 25 y.o. female born on 09/04/1995, is here for treatment of bilateral symptomatic macromastia. MRN: [id number]  CONSENT:  Informed consent was obtained directly from the patient. Risks, benefits and alternatives were fully discussed. Specific risks including but not limited to bleeding, infection, hematoma, seroma, scarring, pain, infection, contracture, asymmetry, wound healing problems, and need for further surgery were all discussed. The patient did have an ample opportunity to have questions answered to satisfaction.   DESCRIPTION OF PROCEDURE:  The patient was marked preoperatively for a Wise pattern skin excision.  The patient was taken to the operating room. SCDs were placed and antibiotics were given. General anesthesia was administered.The patient's operative site was prepped and draped in a sterile fashion. A time out was performed and all information was confirmed to be correct.  Right Breast: The breast was infiltrated with tumescent solution to help with hemostasis.  The nipple was marked with a cookie cutter.  A superomedial pedicle was drawn out  with the base of at least 8 cm in size.  A breast tourniquet was then applied and the pedicle was de-epithelialized.  Breast tourniquet was then let down and all incisions were made with a 10 blade.  The pedicle was then isolated down to the chest wall with cautery and the excision was performed removing tissue primarily inferiorly and laterally.  Hemostasis was obtained and the wound was stapled closed.  Left breast:  The breast was infiltrated with tumescent solution to help with hemostasis.  The nipple was marked with a cookie cutter.  A superomedial pedicle was drawn out with the base of at least 8 cm in size.  A breast tourniquet was then applied and the pedicle was de-epithelialized.  Breast tourniquet was then let down and all incisions were made with a 10 blade.  The pedicle was then isolated down to the chest wall with cautery and the excision was performed removing tissue primarily inferiorly and laterally.  Hemostasis was obtained and the wound was stapled closed.  Patient was then set up to check for size and symmetry.  Minor modifications were made.  This resulted in a total of 1296g removed from the right side and 1270g removed from the left side.  The inframammary incision was closed with a combination of buried in-sorb staples and a running 3-0 Quill suture.  The vertical and periareolar limbs were closed with interrupted buried 4-0 Monocryl and a running 4-0 Quill suture.  Steri-Strips were then applied along with a soft dressing and Ace wrap.  The patient tolerated the procedure well.  There were no complications. The patient was allowed to wake from anesthesia, extubated and taken to the recovery room in satisfactory condition.  I was present for the entire procedure.

## 2020-10-11 NOTE — Interval H&P Note (Signed)
History and Physical Interval Note:  10/11/2020 10:08 AM  Rachel Beck  has presented today for surgery, with the diagnosis of macromastia.  The various methods of treatment have been discussed with the patient and family. After consideration of risks, benefits and other options for treatment, the patient has consented to  Procedure(s) with comments: MAMMARY REDUCTION  (BREAST) (Bilateral) - 2 hours, please as a surgical intervention.  The patient's history has been reviewed, patient examined, no change in status, stable for surgery.  I have reviewed the patient's chart and labs.  Questions were answered to the patient's satisfaction.     Cindra Presume

## 2020-10-11 NOTE — Brief Op Note (Signed)
10/11/2020  12:00 PM  PATIENT:  Rachel Beck  25 y.o. female  PRE-OPERATIVE DIAGNOSIS:  macromastia  POST-OPERATIVE DIAGNOSIS:  macromastia  PROCEDURE:  Procedure(s): MAMMARY REDUCTION  (BREAST) (Bilateral)  SURGEON:  Surgeon(s) and Role:    * Woodford Strege, Steffanie Dunn, MD - Primary  PHYSICIAN ASSISTANT: Software engineer, PA  ASSISTANTS: none   ANESTHESIA:   general  EBL:  40   BLOOD ADMINISTERED:none  DRAINS: none   LOCAL MEDICATIONS USED:  MARCAINE     SPECIMEN:  Source of Specimen:  r and l breast tissue  DISPOSITION OF SPECIMEN:  PATHOLOGY  COUNTS:  YES  TOURNIQUET:  * No tourniquets in log *  DICTATION: .Dragon Dictation  PLAN OF CARE: Discharge to home after PACU  PATIENT DISPOSITION:  PACU - hemodynamically stable.   Delay start of Pharmacological VTE agent (>24hrs) due to surgical blood loss or risk of bleeding: not applicable

## 2020-10-12 ENCOUNTER — Encounter (HOSPITAL_BASED_OUTPATIENT_CLINIC_OR_DEPARTMENT_OTHER): Payer: Self-pay | Admitting: Plastic Surgery

## 2020-10-12 ENCOUNTER — Telehealth: Payer: Self-pay | Admitting: Plastic Surgery

## 2020-10-12 LAB — SURGICAL PATHOLOGY

## 2020-10-12 NOTE — Telephone Encounter (Signed)
Patient called to say that she is having a lot of pain under her breasts when she sits up or stands. That is the only time she feels the pain so she wanted to make sure it's normal. It presses on the incisions a lot. Also, a nurse mentioned to her that there may have been an antibiotic that she would need to take but she only received 3 medications. She wanted to confirm that she does not need to be taking an antibiotic as well. Also, she she was intubated during surgery and she wanted to know why that is? Please call patient to advise and if she does not answer, a detailed message can be left.

## 2020-10-12 NOTE — Telephone Encounter (Signed)
Returned patients call. She is having pain under her breast when she sits, or stands up. I advised her this is normal after surgery to have pain for a week or two, but will get better over time. Indicated an incision was made at both inframammary folds. She underwent a BL breast reduction yesterday. I explained being intubated is a technic used while going under anesthesia. Norco, Zofran, ibuprofen, and Tylenol was sent to her pharmacy. There is no need for an antibiotic to be prescribed unless there is an infection from post surgery. She can call if anything changes, or if she has any further questions. Reminded her of her PO follow up next week on 10/20/2020 at 1:30 with Dr. Claudia Desanctis. Patient understood and agreed.

## 2020-10-14 ENCOUNTER — Telehealth: Payer: Self-pay

## 2020-10-14 NOTE — Telephone Encounter (Signed)
Spoke with patient, advised her that she needs to keep her ace wrap on for a total of complete 3 days from date of surgery. Surgery was on 10/11/20. She may remove the compression, gently remove guaze and take a shower. Do not face shower head, may let soap and water drip down her breast. Do not remove steri strips. Will need to keep compression on 24/7 other when she is taking a shower. Patient understands and agrees.

## 2020-10-14 NOTE — Telephone Encounter (Signed)
Patient called to say that she had breast reduction surgery on Monday and she would like to know if she can remove her wrap and wash today or does she need to wait until tomorrow.  Please call.

## 2020-10-18 ENCOUNTER — Telehealth: Payer: Self-pay | Admitting: *Deleted

## 2020-10-18 NOTE — Telephone Encounter (Signed)
Received prescription refill request for Ibuprofen 600mg -Take 1 tablet by mouth every 6 hours as needed from [facility].  Refill request approved and faxed back to [facility].  Confirmation received.//AB/CMA

## 2020-10-20 ENCOUNTER — Encounter: Payer: Self-pay | Admitting: Plastic Surgery

## 2020-10-20 ENCOUNTER — Other Ambulatory Visit: Payer: Self-pay

## 2020-10-20 ENCOUNTER — Ambulatory Visit (INDEPENDENT_AMBULATORY_CARE_PROVIDER_SITE_OTHER): Payer: BC Managed Care – PPO | Admitting: Plastic Surgery

## 2020-10-20 VITALS — BP 112/76 | HR 92

## 2020-10-20 DIAGNOSIS — N62 Hypertrophy of breast: Secondary | ICD-10-CM

## 2020-10-20 NOTE — Progress Notes (Signed)
Patient presents about 1 week out from bilateral breast reduction.  She feels good and is happy.  On exam everything looks to be healing fine with intact incisions and viable nipple areolar complexes.  There is no subcutaneous fluid.  I have asked her to continue compressive garments and avoid strenuous activity.  We will see her again in a few weeks.

## 2020-10-21 ENCOUNTER — Telehealth: Payer: Self-pay

## 2020-10-21 NOTE — Telephone Encounter (Signed)
Patient called to state she is experiencing intense itching at the incision site of bilateral breasts. She has no fever, chills, or redness.

## 2020-10-21 NOTE — Telephone Encounter (Signed)
Call returned to pt regarding her concern with "itching" on her chest & upper back area.  She believes it is d/t dry skin from wearing dressings & bra constantly. She does not have hives/rash.  She has taken OTC "allergy medication"  & the symptoms have resolved. She has no significant pain or other complications. I reminded her that she can use vaseline or other moisturizer for the surrounding skin- but not on the incision at this time. The steri strips are still intact.  She will allow them to fall off. She asked if she can allow the shower spray on her chest area & I suggested that she can try to allow the water to spray directly on her but only if it is not too forceful. I also instructed her if she needed anything else for the itching- she can continue with the non-drowsy formula during day & try Benadryl at night d/t drowsiness. She understands the plan & will call for any further concerns

## 2020-11-03 ENCOUNTER — Encounter: Payer: BC Managed Care – PPO | Admitting: Surgical

## 2020-11-19 ENCOUNTER — Encounter: Payer: Self-pay | Admitting: Surgical

## 2020-11-19 ENCOUNTER — Ambulatory Visit (INDEPENDENT_AMBULATORY_CARE_PROVIDER_SITE_OTHER): Payer: BC Managed Care – PPO | Admitting: Surgical

## 2020-11-19 ENCOUNTER — Other Ambulatory Visit: Payer: Self-pay

## 2020-11-19 VITALS — BP 102/57 | HR 89

## 2020-11-19 DIAGNOSIS — M546 Pain in thoracic spine: Secondary | ICD-10-CM

## 2020-11-19 DIAGNOSIS — N62 Hypertrophy of breast: Secondary | ICD-10-CM

## 2020-11-19 DIAGNOSIS — M4004 Postural kyphosis, thoracic region: Secondary | ICD-10-CM

## 2020-11-19 DIAGNOSIS — M545 Low back pain, unspecified: Secondary | ICD-10-CM

## 2020-11-19 NOTE — Progress Notes (Signed)
Patient is a 25 year old female here for follow-up after bilateral breast reduction with Dr. Claudia Desanctis on 10/11/2020.  She is approximately 5 weeks postop.  She reports she is overall doing really well.  She reports that she has had some dry skin of her bilateral breasts and has been using Vaseline for this.  She also noticed that she had a suture/stitches protruding through the skin of her right breast.  She has no other concerns.  Chaperone present on exam On exam bilateral NAC's are viable, bilateral breast incisions are intact.  No subcutaneous fluid collections are noted.  No erythema noted.  No foul odors.  No drainage noted.  She does have what appears to be a absorbable staple protruding through the right medial breast incision.  This was easily removed.  Recommend continue her compressive garment 24/7 for 1 more week, then she can transition to wearing just during the day.  Recommend avoiding bra with an underwire at this time.  She can begin wearing a normal bra in a few weeks. I do not see any signs of infection, seroma, hematoma.  We discussed scheduling additional follow-up or following up as needed and she is comfortable following up on an as-needed basis.  I recommend she call with any questions or concerns and we would be happy to see her if she would like to be evaluated.

## 2021-05-17 DIAGNOSIS — N939 Abnormal uterine and vaginal bleeding, unspecified: Secondary | ICD-10-CM

## 2021-05-17 MED ORDER — NORETHIN ACE-ETH ESTRAD-FE 1-20 MG-MCG(24) PO TABS: 1.0000 | ORAL_TABLET | Freq: Every day | ORAL | 1 refills | Status: DC

## 2021-06-16 ENCOUNTER — Other Ambulatory Visit: Payer: Self-pay

## 2021-06-16 DIAGNOSIS — N939 Abnormal uterine and vaginal bleeding, unspecified: Secondary | ICD-10-CM

## 2021-06-16 MED ORDER — NORETHIN ACE-ETH ESTRAD-FE 1-20 MG-MCG(24) PO TABS: 1.0000 | ORAL_TABLET | Freq: Every day | ORAL | 3 refills | Status: DC

## 2021-06-16 NOTE — Telephone Encounter (Signed)
Received request from [facility] needing new Rx for 90 day supply.  New Rx sent via e-script.  Next Annual exam scheduled for 07/11/21.  Colletta Maryland, RN

## 2021-07-11 ENCOUNTER — Encounter: Payer: Self-pay | Admitting: Family Medicine

## 2021-07-11 ENCOUNTER — Other Ambulatory Visit (HOSPITAL_COMMUNITY)
Admission: RE | Admit: 2021-07-11 | Discharge: 2021-07-11 | Disposition: A | Discharge status: Home / Self Care | Payer: BC Managed Care – PPO | Source: Ambulatory Visit | Attending: Family Medicine | Admitting: Family Medicine

## 2021-07-11 ENCOUNTER — Ambulatory Visit (INDEPENDENT_AMBULATORY_CARE_PROVIDER_SITE_OTHER): Payer: BC Managed Care – PPO | Admitting: Family Medicine

## 2021-07-11 ENCOUNTER — Other Ambulatory Visit: Payer: Self-pay

## 2021-07-11 VITALS — BP 121/83 | HR 90 | Ht 62.0 in | Wt 191.5 lb

## 2021-07-11 DIAGNOSIS — Z01419 Encounter for gynecological examination (general) (routine) without abnormal findings: Secondary | ICD-10-CM | POA: Diagnosis not present

## 2021-07-11 DIAGNOSIS — Z124 Encounter for screening for malignant neoplasm of cervix: Secondary | ICD-10-CM | POA: Diagnosis not present

## 2021-07-11 DIAGNOSIS — N939 Abnormal uterine and vaginal bleeding, unspecified: Secondary | ICD-10-CM

## 2021-07-11 DIAGNOSIS — Z01411 Encounter for gynecological examination (general) (routine) with abnormal findings: Secondary | ICD-10-CM

## 2021-07-11 DIAGNOSIS — Z113 Encounter for screening for infections with a predominantly sexual mode of transmission: Secondary | ICD-10-CM

## 2021-07-11 MED ORDER — NORETHIN ACE-ETH ESTRAD-FE 1-20 MG-MCG(24) PO TABS: 1.0000 | ORAL_TABLET | Freq: Every day | ORAL | 3 refills | Status: DC

## 2021-07-11 NOTE — Patient Instructions (Signed)

## 2021-07-11 NOTE — Progress Notes (Signed)
Subjective:     Rachel Beck is a 25 y.o. female and is here for a comprehensive physical exam. The patient reports no problems. On lo-estrin, no issues.   The following portions of the patient's history were reviewed and updated as appropriate: allergies, current medications, past family history, past medical history, past social history, past surgical history, and problem list.  Review of Systems Pertinent items noted in HPI and remainder of comprehensive ROS otherwise negative.   Objective:    BP 121/83   Pulse 90   Ht 5\' 2"  (1.575 m)   Wt 191 lb 8 oz (86.9 kg)   LMP 06/09/2021   BMI 35.03 kg/m  General appearance: alert, cooperative, and appears stated age Head: Normocephalic, without obvious abnormality, atraumatic Neck: no adenopathy, supple, symmetrical, trachea midline, and thyroid not enlarged, symmetric, no tenderness/mass/nodules Lungs: clear to auscultation bilaterally Breasts: normal appearance, no masses or tenderness Heart: regular rate and rhythm, S1, S2 normal, no murmur, click, rub or gallop Abdomen: soft, non-tender; bowel sounds normal; no masses,  no organomegaly Pelvic: cervix normal in appearance, external genitalia normal, no adnexal masses or tenderness, no cervical motion tenderness, uterus normal size, shape, and consistency, and vagina normal without discharge Extremities: extremities normal, atraumatic, no cyanosis or edema Pulses: 2+ and symmetric Skin: Skin color, texture, turgor normal. No rashes or lesions Lymph nodes: Cervical, supraclavicular, and axillary nodes normal. Neurologic: Grossly normal    Assessment:    Healthy female exam.      Plan:  Well female exam with routine gynecological exam - Plan: Cytology - PAP( [place])  Screen for STD (sexually transmitted disease) - Plan: Cytology - PAP( [place])  Screening for malignant neoplasm of cervix  Encounter for gynecological examination with abnormal finding  Abnormal  vaginal bleeding - Plan: Norethindrone Acetate-Ethinyl Estrad-FE (LOESTRIN 24 FE) 1-20 MG-MCG(24) tablet  Return in 1 year (on 07/11/2022).    See After Visit Summary for Counseling Recommendations

## 2021-07-11 NOTE — Progress Notes (Signed)
Pt here for just annual Pap with Pap STD screening only no blood work.

## 2021-07-18 LAB — CYTOLOGY - PAP
Chlamydia: NEGATIVE
Comment: NEGATIVE
Comment: NEGATIVE
Comment: NORMAL
Diagnosis: NEGATIVE
Neisseria Gonorrhea: NEGATIVE
Trichomonas: NEGATIVE

## 2021-10-11 ENCOUNTER — Other Ambulatory Visit: Payer: Self-pay | Admitting: Nurse Practitioner

## 2021-10-11 DIAGNOSIS — N939 Abnormal uterine and vaginal bleeding, unspecified: Secondary | ICD-10-CM

## 2021-10-29 IMAGING — US US SONOHYSTEROGRAM - WITH RAD
2 series · 14 of 25 positions shown · non-contrast
Comparison: Ultrasound on 05/21/2019

CLINICAL DATA: Abnormal uterine bleeding. Thickened endometrium on
prior ultrasound.

EXAM:
US SONOHYSTEROGRAM
TECHNIQUE: Following cleansing of the cervix and vagina with Betadine, a
hysterosalpingogram catheter was placed within the endocervical
canal. Sonohysterogram was then performed with transvaginal
sonography during infusion of sterile saline solution into the
endometrial cavity.

[Series 1: us sonohysterogram - with rad · 0.11mm/px · 7 of 20 slices shown (1 of 2)]
[im 1/20]
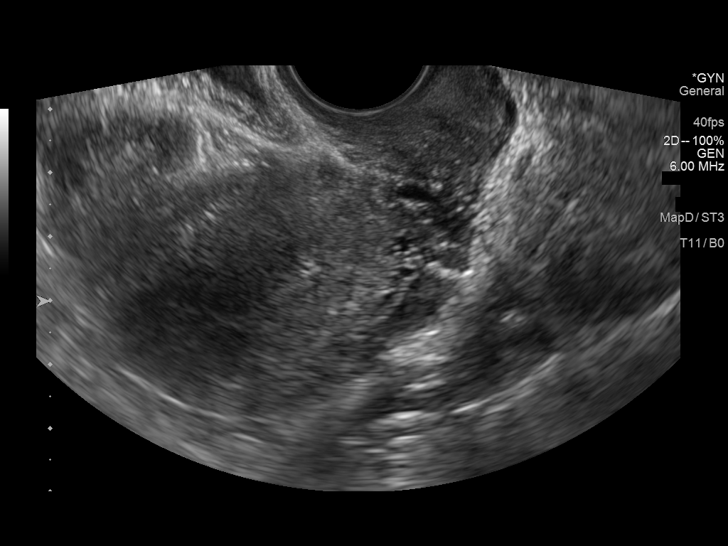
[im 4/20]
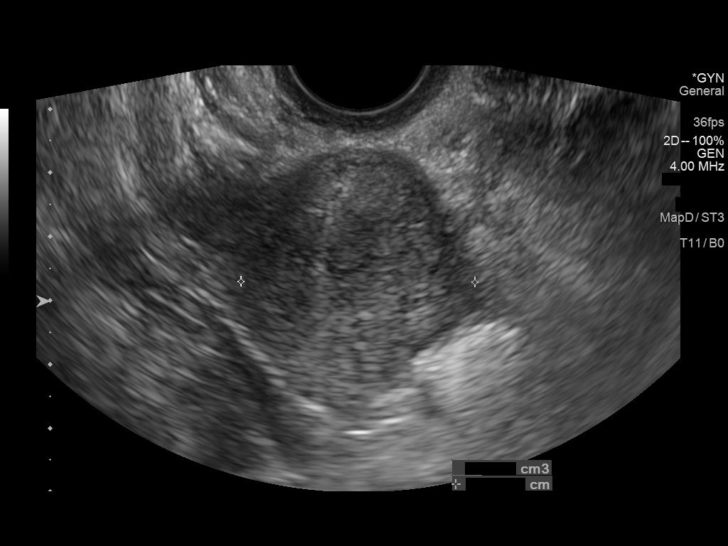
[im 7/20]
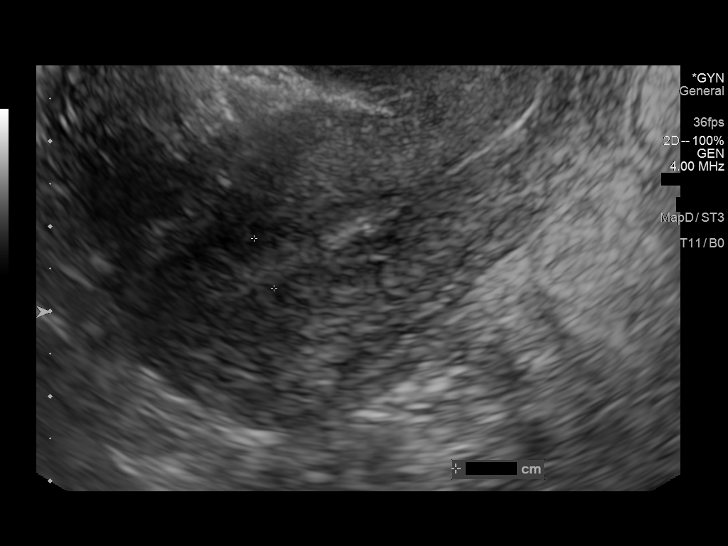
[im 10/20]
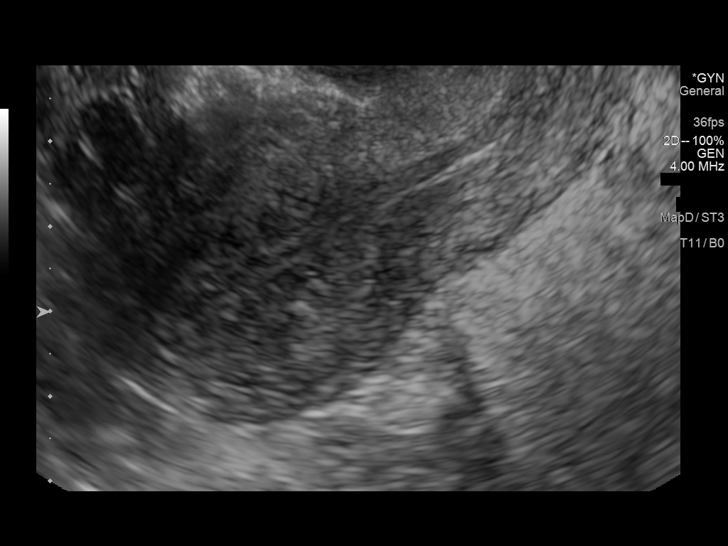
[im 13/20]
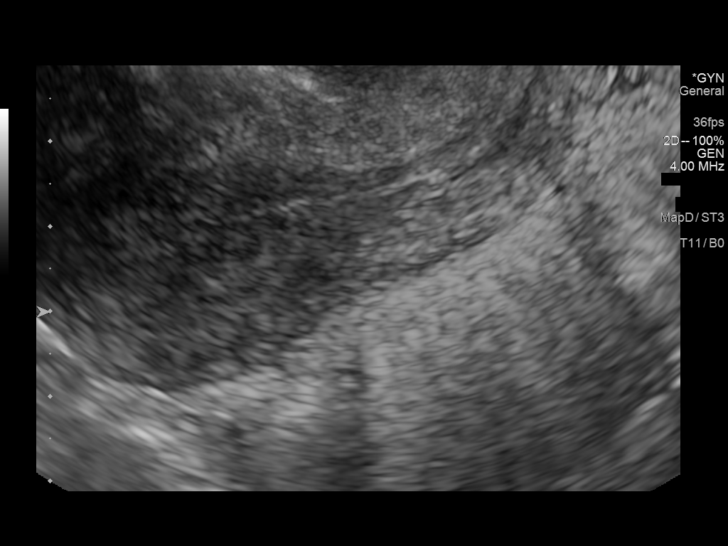
[im 15/20]
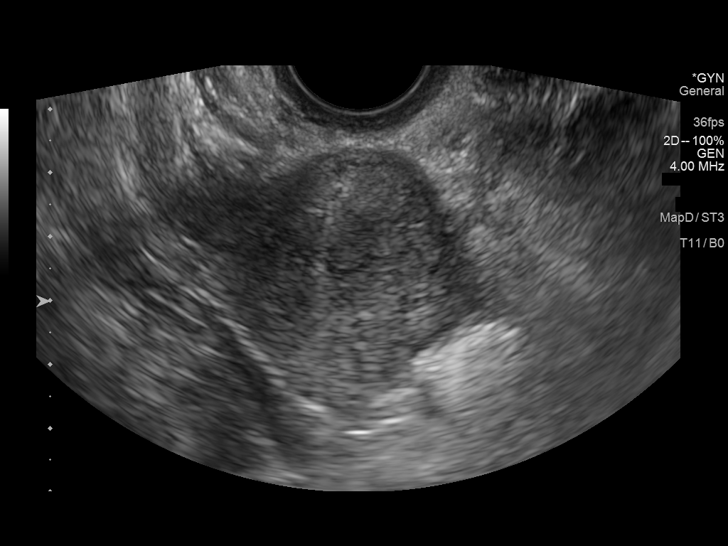
[im 18/20]
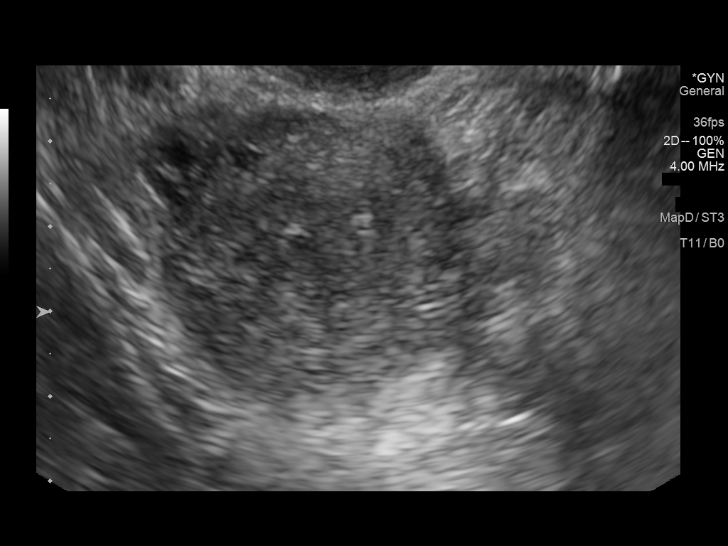

[Series 2: us sonohysterogram - with rad · 0.10mm/px · 7 of 20 slices shown (2 of 2)]
[im 1/20]
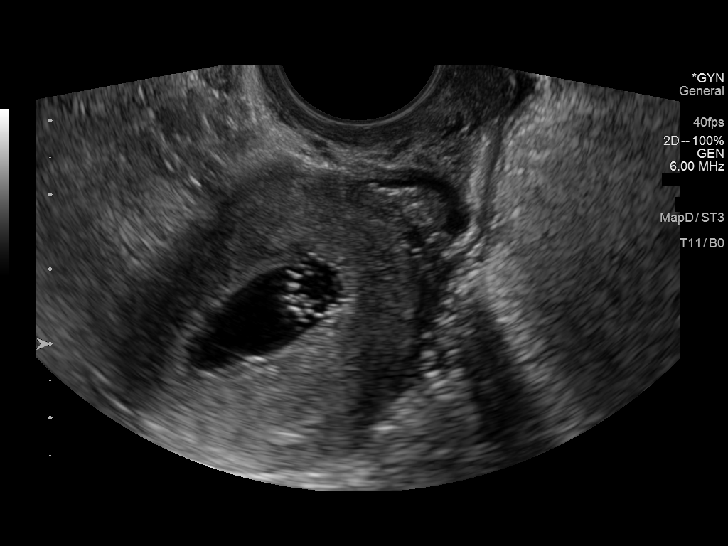
[im 4/20]
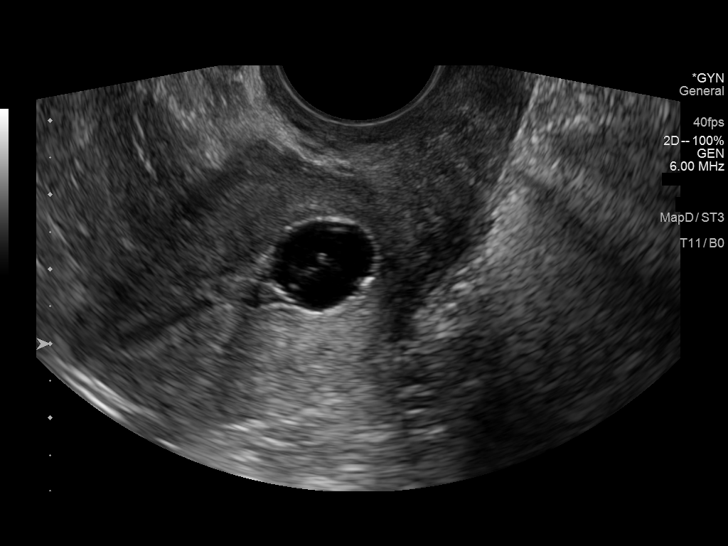
[im 6/20]
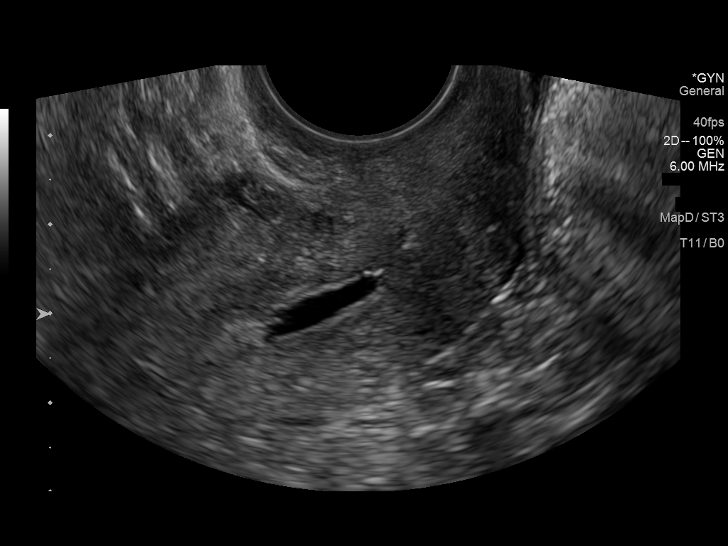
[im 9/20]
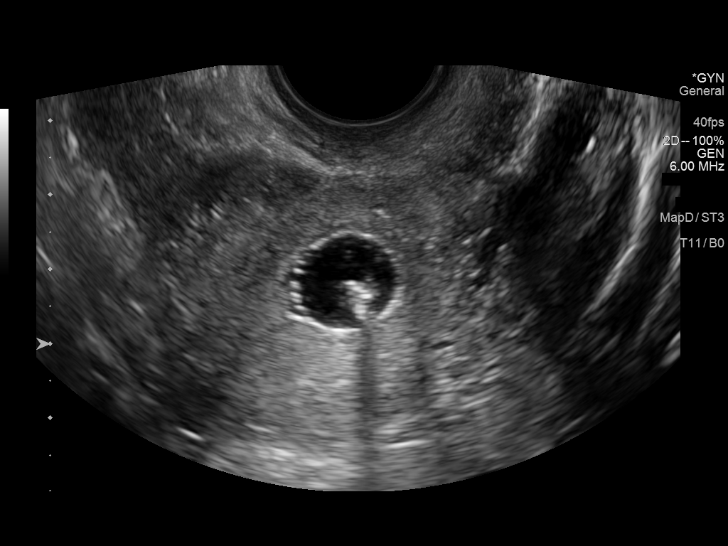
[im 13/20]
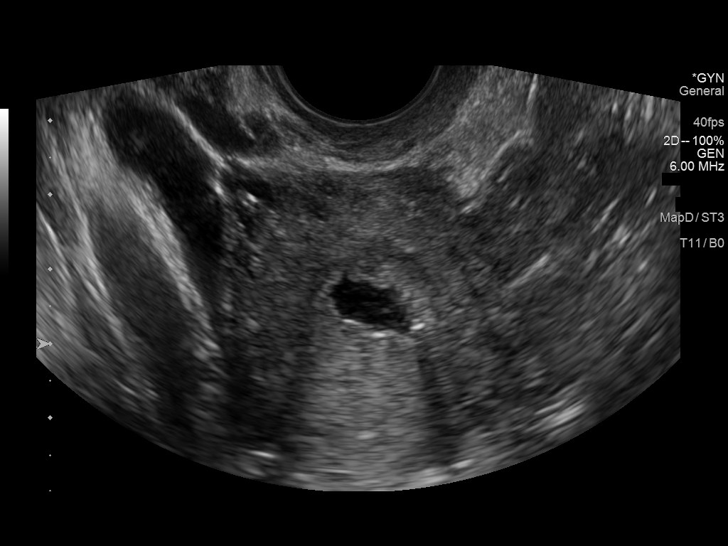
[im 16/20]
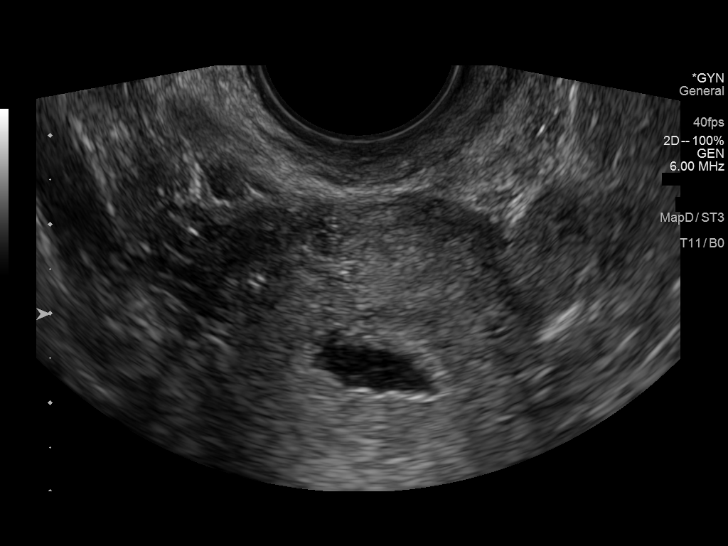
[im 20/20]
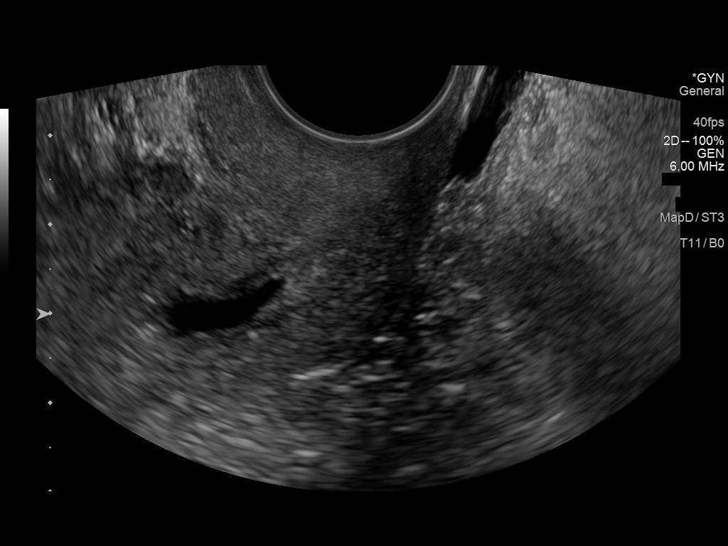

[14 of 25 positions shown; findings below may reference images not displayed]

FINDINGS: No uterine fibroids are identified. Endometrial thickness on today's
exam measures 6 mm, decreased since prior exam. No polyps, masses or
focal areas of thickening are seen involving the endometrium. No
submucosal fibroids or other abnormality of the endometrial cavity
identified.
IMPRESSION: Endometrial thickness currently measures 6 mm. Negative
sonohysterogram. No evidence of focal endometrial pathology or
submucosal fibroids.

## 2021-11-02 ENCOUNTER — Encounter: Payer: Self-pay | Admitting: Family Medicine

## 2022-04-04 ENCOUNTER — Emergency Department (HOSPITAL_BASED_OUTPATIENT_CLINIC_OR_DEPARTMENT_OTHER)
Admission: EM | Admit: 2022-04-04 | Discharge: 2022-04-04 | Disposition: A | Discharge status: Home / Self Care | Payer: BC Managed Care – PPO | Attending: Emergency Medicine | Admitting: Emergency Medicine

## 2022-04-04 ENCOUNTER — Encounter (HOSPITAL_BASED_OUTPATIENT_CLINIC_OR_DEPARTMENT_OTHER): Payer: Self-pay | Admitting: Urology

## 2022-04-04 DIAGNOSIS — U071 COVID-19: Secondary | ICD-10-CM

## 2022-04-04 DIAGNOSIS — Z91040 Latex allergy status: Secondary | ICD-10-CM | POA: Diagnosis not present

## 2022-04-04 DIAGNOSIS — M7918 Myalgia, other site: Secondary | ICD-10-CM | POA: Diagnosis not present

## 2022-04-04 LAB — SARS CORONAVIRUS 2 BY RT PCR: SARS Coronavirus 2 by RT PCR: POSITIVE — AB

## 2022-04-04 NOTE — ED Provider Notes (Signed)
[facility] EMERGENCY DEPARTMENT Provider Note   CSN: [id number] Arrival date & time: 04/04/22  1239     History  Chief Complaint  Patient presents with   Covid Exposure    Rachel Beck is a 26 y.o. female.  Patient presents to the emergency department for evaluation of 1 day history of body aches, chills, headache, general fatigue.  No nausea or vomiting.  She reports a change in smell.  She has had some diarrhea but no urinary symptoms.  No known sick contacts.  She reports 1 previous infection with COVID.  She has not received vaccines.       Home Medications Prior to Admission medications   Medication Sig Start Date End Date Taking? Authorizing Provider  acetaminophen (TYLENOL) 500 MG tablet Take 1 tablet (500 mg total) by mouth every 6 (six) hours as needed. For use AFTER surgery 10/05/20   Phoebe Sharps C, PA-C  ibuprofen (ADVIL) 600 MG tablet Take 1 tablet (600 mg total) by mouth every 6 (six) hours as needed for mild pain or moderate pain. For use AFTER surgery Patient not taking: Reported on 07/11/2021 10/05/20   Phoebe Sharps C, PA-C  Norethindrone Acetate-Ethinyl Estrad-FE (LOESTRIN 24 FE) 1-20 MG-MCG(24) tablet Take 1 tablet by mouth daily. 07/11/21 07/06/22  Donnamae Jude, MD      Allergies    Latex, Mango flavor, Pineapple, and Tomato    Review of Systems   Review of Systems  Physical Exam Updated Vital Signs BP (!) 140/103 (BP Location: Left Arm)   Pulse 87   Temp 98.9 F (37.2 C) (Oral)   Resp 18   Ht '5\' 2"'$  (1.575 m)   Wt 82.6 kg   LMP 03/14/2022 (Approximate)   SpO2 98%   BMI 33.29 kg/m   Physical Exam Vitals and nursing note reviewed.  Constitutional:      Appearance: She is well-developed.  HENT:     Head: Normocephalic and atraumatic.     Jaw: No trismus.     Right Ear: Tympanic membrane, ear canal and external ear normal.     Left Ear: Tympanic membrane, ear canal and external ear normal.     Nose: Congestion present.  No mucosal edema or rhinorrhea.     Mouth/Throat:     Mouth: Mucous membranes are moist. Mucous membranes are not dry. No oral lesions.     Pharynx: Uvula midline. No oropharyngeal exudate, posterior oropharyngeal erythema or uvula swelling.     Tonsils: No tonsillar abscesses.  Eyes:     General:        Right eye: No discharge.        Left eye: No discharge.     Conjunctiva/sclera: Conjunctivae normal.  Cardiovascular:     Rate and Rhythm: Normal rate and regular rhythm.     Heart sounds: Normal heart sounds.  Pulmonary:     Effort: Pulmonary effort is normal. No respiratory distress.     Breath sounds: Normal breath sounds. No wheezing or rales.  Abdominal:     Palpations: Abdomen is soft.     Tenderness: There is no abdominal tenderness.  Musculoskeletal:     Cervical back: Normal range of motion and neck supple.  Lymphadenopathy:     Cervical: No cervical adenopathy.  Skin:    General: Skin is warm and dry.  Neurological:     Mental Status: She is alert.  Psychiatric:        Mood and Affect: Mood normal.  ED Results / Procedures / Treatments   Labs (all labs ordered are listed, but only abnormal results are displayed) Labs Reviewed  SARS CORONAVIRUS 2 BY RT PCR - Abnormal; Notable for the following components:      Result Value   SARS Coronavirus 2 by RT PCR POSITIVE (*)    All other components within normal limits    EKG None  Radiology No results found.  Procedures Procedures    Medications Ordered in ED Medications - No data to display  ED Course/ Medical Decision Making/ A&P    Patient seen and examined. History obtained directly from patient. Work-up including labs, imaging, EKG ordered in triage, if performed, were reviewed.    Labs/EKG: Independently reviewed and interpreted.  This included: COVID testing positive  Imaging: None ordered  Medications/Fluids: None ordered  Most recent vital signs reviewed and are as follows: BP (!)  140/103 (BP Location: Left Arm)   Pulse 87   Temp 98.9 F (37.2 C) (Oral)   Resp 18   Ht '5\' 2"'$  (1.575 m)   Wt 82.6 kg   LMP 03/14/2022 (Approximate)   SpO2 98%   BMI 33.29 kg/m   Initial impression: COVID infection  Detailed discussion had with with patient regarding COVID-19 precautions and written instructions given as well.  We discussed need to isolate themselves for 5 days from onset of symptoms and have 24 hours of improvement prior to breaking isolation.  We discussed that when breaking isolation, mask wearing for 5 additional days is required.  We discussed signs symptoms to return which include worsening shortness of breath, trouble breathing, or increased work of breathing.  Also return with persistent vomiting, confusion, passing out, or if they have any other concerns. Counseled on the need for rest and good hydration. Discussed that high-risk contacts should be aware of positive result and they need to quarantine and be tested if they develop any symptoms. Patient verbalizes understanding.   We discussed risks and benefits of antiviral therapy.  Patient is in window.  She declines therapy at this time.  Rachel Beck was evaluated in Emergency Department on 04/04/2022 for the symptoms described in the history of present illness. She was evaluated in the context of the global COVID-19 pandemic, which necessitated consideration that the patient might be at risk for infection with the SARS-CoV-2 virus that causes COVID-19. Institutional protocols and algorithms that pertain to the evaluation of patients at risk for COVID-19 are in a state of rapid change based on information released by regulatory bodies including the CDC and federal and state organizations. These policies and algorithms were followed during the patient's care in the ED.                           Medical Decision Making  Patient presents with viral symptoms, but tested positive for COVID.  Low concern for  pneumonia, strep throat.  Vital signs are normal.  No concern for sepsis at this time.        Final Clinical Impression(s) / ED Diagnoses Final diagnoses:  COVID-19 virus infection    Rx / DC Orders ED Discharge Orders     None         Carlisle Cater, PA-C 04/04/22 1447    Charlesetta Shanks, MD 04/09/22 2005

## 2022-04-04 NOTE — Discharge Instructions (Signed)
Please read and follow all provided instructions.  Your diagnoses today include:  1. COVID-19 virus infection     Tests performed today include: Vital signs. See below for your results today.  COVID test - Positive  Medications prescribed:  None  Take any prescribed medications only as directed. Treatment for your infection is aimed at treating the symptoms. There are no medications, such as antibiotics, that will cure your infection.   Home care instructions:  Follow any educational materials contained in this packet.   Your illness is contagious and can be spread to others, especially during the first 3 or 4 days. It cannot be cured by antibiotics or other medicines. Take basic precautions such as washing your hands often, covering your mouth when you cough or sneeze, and avoiding public places where you could spread your illness to others.   Please continue drinking plenty of fluids.  Use over-the-counter medicines as needed as directed on packaging for symptom relief.  You may also use ibuprofen or tylenol as directed on packaging for pain or fever.  Do not take multiple medicines containing Tylenol or acetaminophen to avoid taking too much of this medication.  If you are positive for Covid-19, you should isolate yourself and not be exposed to other people for 5 days after your symptoms began. If you are not feeling better at day 5, you need to isolate yourself for a total of 10 days. If you are feeling better by day 5, you should wear a mask properly, over your nose and mouth, at all times while around other people until 10 days after your symptoms started.   Follow-up instructions: Please follow-up with your primary care provider as needed for further evaluation of your symptoms if you are not feeling better.   Return instructions:  Please return to the Emergency Department if you experience worsening symptoms.  Return to the emergency department if you have worsening shortness of  breath breathing or increased work of breathing, persistent vomiting RETURN IMMEDIATELY IF you develop shortness of breath, confusion or altered mental status, a new rash, become dizzy, faint, or poorly responsive, or are unable to be cared for at home. Please return if you have persistent vomiting and cannot keep down fluids or develop a fever that is not controlled by tylenol or motrin.   Please return if you have any other emergent concerns.  Additional Information:  Your vital signs today were: BP (!) 140/103 (BP Location: Left Arm)   Pulse 87   Temp 98.9 F (37.2 C) (Oral)   Resp 18   Ht '5\' 2"'$  (1.575 m)   Wt 82.6 kg   LMP 03/14/2022 (Approximate)   SpO2 98%   BMI 33.29 kg/m  If your blood pressure (BP) was elevated above 135/85 this visit, please have this repeated by your doctor within one month. --------------

## 2022-04-04 NOTE — ED Notes (Signed)
Pt states she feels "run down". Getting night sweats. Diarrhea, body aches, no appetite. Pt GCS of 15 and in no pain.

## 2022-04-04 NOTE — ED Triage Notes (Signed)
Pt states COVID exposure  States body aches, fever, headache,and loss of smell since yesterday  Decreased appetite Denies n/v

## 2022-04-11 ENCOUNTER — Encounter: Payer: Self-pay | Admitting: Family Medicine

## 2022-07-17 ENCOUNTER — Other Ambulatory Visit: Payer: Self-pay | Admitting: Lactation Services

## 2022-07-17 ENCOUNTER — Encounter: Payer: Self-pay | Admitting: Family Medicine

## 2022-07-17 MED ORDER — NORETHIN ACE-ETH ESTRAD-FE 1-20 MG-MCG(24) PO TABS: 1.0000 | ORAL_TABLET | Freq: Every day | ORAL | 2 refills | Status: DC

## 2022-07-17 NOTE — Progress Notes (Signed)
Ordered 3 months of OCP's per standing order. Patient has annual exam scheduled for January 2024.

## 2022-08-20 IMAGING — DX DG CHEST 1V PORT
1 series · 1 of 1 positions shown · non-contrast
Comparison: 12/28/2003

CLINICAL DATA: Cough

EXAM:
PORTABLE CHEST 1 VIEW

[chest ap]
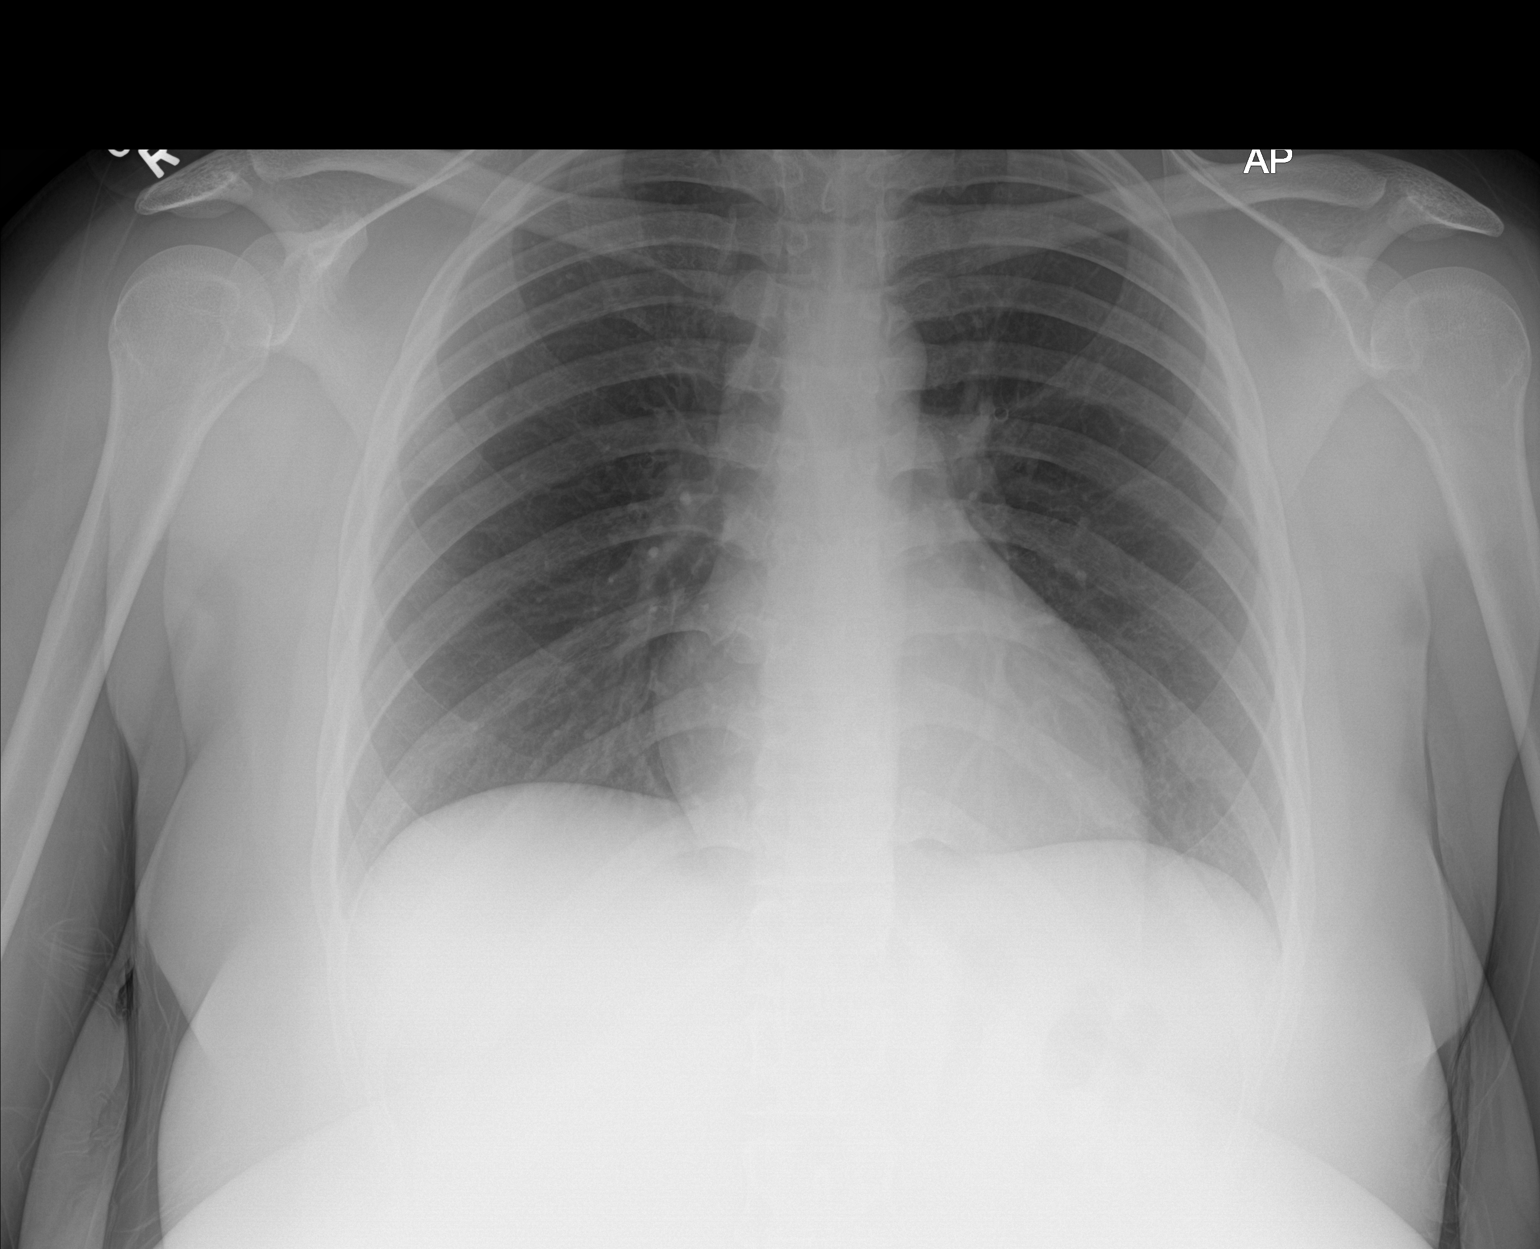

[1 of 1 positions shown; findings below may reference images not displayed]

FINDINGS: The heart size and mediastinal contours are within normal limits.
Both lungs are clear. The visualized skeletal structures are
unremarkable.
IMPRESSION: No active disease.

## 2022-09-08 ENCOUNTER — Encounter: Payer: Self-pay | Admitting: Family Medicine

## 2022-09-08 ENCOUNTER — Ambulatory Visit (INDEPENDENT_AMBULATORY_CARE_PROVIDER_SITE_OTHER): Payer: BC Managed Care – PPO | Admitting: Family Medicine

## 2022-09-08 VITALS — BP 127/86 | HR 114 | Wt 188.9 lb

## 2022-09-08 DIAGNOSIS — Z01419 Encounter for gynecological examination (general) (routine) without abnormal findings: Secondary | ICD-10-CM | POA: Diagnosis not present

## 2022-09-08 DIAGNOSIS — K219 Gastro-esophageal reflux disease without esophagitis: Secondary | ICD-10-CM | POA: Insufficient documentation

## 2022-09-08 DIAGNOSIS — Z3041 Encounter for surveillance of contraceptive pills: Secondary | ICD-10-CM

## 2022-09-08 DIAGNOSIS — T7840XA Allergy, unspecified, initial encounter: Secondary | ICD-10-CM | POA: Diagnosis not present

## 2022-09-08 MED ORDER — EPINEPHRINE 0.3 MG/0.3ML IJ SOAJ: 0.3000 mg | INTRAMUSCULAR | 0 refills | Status: AC | PRN

## 2022-09-08 MED ORDER — NORETHIN ACE-ETH ESTRAD-FE 1-20 MG-MCG(24) PO TABS: 1.0000 | ORAL_TABLET | Freq: Every day | ORAL | 3 refills | Status: DC

## 2022-09-08 MED ORDER — FAMOTIDINE 20 MG PO TABS: 20.0000 mg | ORAL_TABLET | Freq: Two times a day (BID) | ORAL | 3 refills | Status: DC | PRN

## 2022-09-08 MED ORDER — CETIRIZINE HCL 10 MG PO TABS: 10.0000 mg | ORAL_TABLET | Freq: Every day | ORAL | 2 refills | Status: AC | PRN

## 2022-09-08 NOTE — Progress Notes (Signed)
ANNUAL EXAM Patient name: Rachel Beck MRN [id number]  Date of birth: February 26, 1996 Chief Complaint:   Gynecologic Exam  History of Present Illness:   Rachel Beck is a 27 y.o. G0P0000 African-American female being seen today for a routine annual exam.  Current complaints: Right shoulder popping, acid reflux, allergy medication prescription  The patient presents today for an annual exam. She is complaining about some right shoulder popping that has been going on for 1 year. She states that when she moves her arm around she gets an occasional popping sensation. She describes this as uncomfortable but says that it is not painful. She does not report any weakness of that shoulder or numbness and tingling of her fingers. The patient also wants to discuss symptoms of acid reflux. She says that when she belches, some mucous and food comes up to the back of her throat. These episodes of acid reflux are not associated with eating and occur randomly throughout the day. She is not experiencing any chest pain as a result of this reflux. The patient would also like to discuss being prescribes allergy medication. 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 When she is exposed to an allergen she gets itchy all over her body and tends to break out in hives on her hands. She takes over the counter Rachel Beck which alleviates her symptoms and she would like to discuss getting a prescription of Rachel Beck so that it is covered by her insurance. Birth control options were also discussed. The patient is happy with her current birth control method of Rachel Beck and she would like to remain on this regimen. The patient would also like a referral for a PCP.  Patient's last menstrual period was 09/01/2022.    Last pap 07/11/2021. Results were: Normal. H/O abnormal pap: no      09/08/2022   11:07 AM 04/07/2020    1:46 PM 06/25/2019    9:07 AM 06/25/2019    8:34 AM 08/10/2015    9:44 AM  Depression screen PHQ 2/9   Decreased Interest 0 1 0 0 0  Down, Depressed, Hopeless 0 0 0 0 0  PHQ - 2 Score 0 1 0 0 0  Altered sleeping 1 2 0 2   Tired, decreased energy 1 0 0 0   Change in appetite 1 2 0 0   Feeling bad or failure about yourself  0 0 0 0   Trouble concentrating 0 0 0 0   Moving slowly or fidgety/restless 0 0 0 0   Suicidal thoughts 0 0 0 0   PHQ-9 Score 3 5 0 2         09/08/2022   11:08 AM 04/07/2020    1:46 PM 06/25/2019    9:07 AM 06/25/2019    8:34 AM  GAD 7 : Generalized Anxiety Score  Nervous, Anxious, on Edge 1 0 0 0  Control/stop worrying 0 0 0 0  Worry too much - different things 0 0 0 0  Trouble relaxing 0 0 0 0  Restless 0 0 0 0  Easily annoyed or irritable 2 0 0 0  Afraid - awful might happen 2 0 0 0  Total GAD 7 Score 5 0 0 0     Review of Systems:   Pertinent items are noted in HPI Denies any headaches, blurred vision, fatigue, shortness of breath, chest pain, abdominal pain, abnormal vaginal discharge/itching/odor/irritation, problems with periods, bowel movements, urination, or intercourse unless otherwise stated above. Pertinent  History Reviewed:  Reviewed past medical,surgical, social and family history.  Reviewed problem list, medications and allergies. Physical Assessment:   Vitals:   09/08/22 1106  Weight: 188 lb 14.4 oz (85.7 kg)  Body mass index is 34.55 kg/m.        Physical Examination:   General appearance - well appearing, and in no distress  Mental status - alert, oriented to person, place, and time  Psych:  She has a normal mood and affect  Skin - warm and dry, normal color, no suspicious lesions noted  Chest - effort normal, all lung fields clear to auscultation bilaterally  Breast - no mass, nipples are everted, old scar from breast reduction  Lymph - no axillary or supraclavicular lymph nodes noted  Neck - normal thyroid  Heart - normal rate and regular rhythm  Abdomen - soft, nontender, nondistended, no masses or organomegaly Extremities:  Popping sensation of right shoulder on movement. Sensation of right shoulder tightness on shoulder extension. Full ROM. No pain noted on palpation or movement of right shoulder. No shoulder weakness bilaterally. Grip Strength 5/5 bilaterally.     No results found for this or any previous visit (from the past 24 hour(s)).  Assessment & Plan:  1) Well-Woman Exam  2) Acid Reflux 99214  Discussed the use of OTC TUMS to see if this alleviates symptoms  Prescribed Pepcid 20 mg as H2 blocker will also help with her allergies  3) Allergy Medication 99214  Prescribed Rachel Beck 10 mg  Prescribed EPI pen as needed for anaphylaxis  4) Right shoulder popping 99214 Continue to monitor. If it begins to worsen or interfere with daily life, PCP or Ortho referral for further imaging.  5) Birth control [phone] Continue Rachel Beck. Discussed other birth control options. Patient was content with current regimen  6) Requested referral for PCP  List of PCPs sent to patient via MyChart  7) Annual Labs drawn [phone]  CBC. TSH. CMP, A1C, Lipid Panel  Labs/procedures today: [name] was seen today for gynecologic exam.  Diagnoses and all orders for this visit:  Encounter for gynecological examination without abnormal finding -     CBC -     TSH -     Comprehensive metabolic panel -     Hemoglobin A1c -     Lipid panel  Allergic reaction, initial encounter -     cetirizine (Rachel Beck ALLERGY) 10 MG tablet; Take 1 tablet (10 mg total) by mouth daily as needed for allergies. -     EPINEPHrine 0.3 mg/0.3 mL IJ SOAJ injection; Inject 0.3 mg into the muscle as needed for anaphylaxis.  Gastroesophageal reflux disease without esophagitis -     famotidine (PEPCID) 20 MG tablet; Take 1 tablet (20 mg total) by mouth 2 (two) times daily as needed for heartburn or indigestion.  Surveillance for birth control, oral contraceptives -     Norethindrone Acetate-Ethinyl Estrad-FE (Rachel Beck 24 FE) 1-20 MG-MCG(24) tablet; Take 1  tablet by mouth daily.   Mammogram: @ 28yo, or sooner if problems Colonoscopy: @ 28yo, or sooner if problems   Follow-up: Return in 1 year (on 09/09/2023).  Talmage Coin [name], Student-PA 09/08/2022 11:23 AM

## 2022-09-08 NOTE — Patient Instructions (Signed)

## 2022-09-09 LAB — COMPREHENSIVE METABOLIC PANEL
ALT: 15 IU/L (ref 0–32)
AST: 14 IU/L (ref 0–40)
Albumin/Globulin Ratio: 1.7 (ref 1.2–2.2)
Albumin: 4.4 g/dL (ref 4.0–5.0)
Alkaline Phosphatase: 110 IU/L (ref 44–121)
BUN/Creatinine Ratio: 15 (ref 9–23)
BUN: 11 mg/dL (ref 6–20)
Bilirubin Total: 0.5 mg/dL (ref 0.0–1.2)
CO2: 23 mmol/L (ref 20–29)
Calcium: 9.5 mg/dL (ref 8.7–10.2)
Chloride: 99 mmol/L (ref 96–106)
Creatinine, Ser: 0.74 mg/dL (ref 0.57–1.00)
Globulin, Total: 2.6 g/dL (ref 1.5–4.5)
Glucose: 85 mg/dL (ref 70–99)
Potassium: 4.2 mmol/L (ref 3.5–5.2)
Sodium: 136 mmol/L (ref 134–144)
Total Protein: 7 g/dL (ref 6.0–8.5)
eGFR: 114 mL/min/{1.73_m2} (ref >59)

## 2022-09-09 LAB — CBC
Hematocrit: 43.3 % (ref 34.0–46.6)
Hemoglobin: 14.7 g/dL (ref 11.1–15.9)
MCH: 28.7 pg (ref 26.6–33.0)
MCHC: 33.9 g/dL (ref 31.5–35.7)
MCV: 84 fL (ref 79–97)
Platelets: 287 10*3/uL (ref 150–450)
RBC: 5.13 x10E6/uL (ref 3.77–5.28)
RDW: 13 % (ref 11.7–15.4)
WBC: 5.1 10*3/uL (ref 3.4–10.8)

## 2022-09-09 LAB — HEMOGLOBIN A1C
Est. average glucose Bld gHb Est-mCnc: 117 mg/dL
Hgb A1c MFr Bld: 5.7 % — ABNORMAL HIGH (ref 4.8–5.6)

## 2022-09-09 LAB — LIPID PANEL
Chol/HDL Ratio: 3.1 ratio (ref 0.0–4.4)
Cholesterol, Total: 170 mg/dL (ref 100–199)
HDL: 55 mg/dL (ref >39)
LDL Chol Calc (NIH): 106 mg/dL — ABNORMAL HIGH (ref 0–99)
Triglycerides: 41 mg/dL (ref 0–149)
VLDL Cholesterol Cal: 9 mg/dL (ref 5–40)

## 2022-09-09 LAB — TSH: TSH: 1.03 u[IU]/mL (ref 0.450–4.500)

## 2022-09-12 ENCOUNTER — Telehealth: Payer: Self-pay | Admitting: Lactation Services

## 2022-09-12 NOTE — Telephone Encounter (Signed)
Received PA request for Epi pen from [employer].    Called [facility] and spoke with [place] and reports initial claim was rejected but was approved on 09/09/2022. No further action needed.

## 2022-09-15 ENCOUNTER — Encounter: Payer: Self-pay | Admitting: Family Medicine

## 2022-10-05 ENCOUNTER — Encounter: Payer: Self-pay | Admitting: Family Medicine

## 2022-10-12 ENCOUNTER — Encounter: Payer: Self-pay | Admitting: General Practice

## 2022-11-20 ENCOUNTER — Ambulatory Visit (INDEPENDENT_AMBULATORY_CARE_PROVIDER_SITE_OTHER): Payer: BC Managed Care – PPO | Admitting: Family Medicine

## 2022-11-20 ENCOUNTER — Encounter: Payer: Self-pay | Admitting: Family Medicine

## 2022-11-20 VITALS — BP 102/78 | HR 70 | Temp 98.2°F | Ht 62.5 in | Wt 188.9 lb

## 2022-11-20 DIAGNOSIS — R7301 Impaired fasting glucose: Secondary | ICD-10-CM

## 2022-11-20 DIAGNOSIS — F419 Anxiety disorder, unspecified: Secondary | ICD-10-CM

## 2022-11-20 DIAGNOSIS — L500 Allergic urticaria: Secondary | ICD-10-CM | POA: Diagnosis not present

## 2022-11-20 DIAGNOSIS — F32A Depression, unspecified: Secondary | ICD-10-CM

## 2022-11-20 NOTE — Patient Instructions (Addendum)
Lose It!  Carb manager  100 gram of carbs per day  Melatonin 5-10 mg at night

## 2022-11-20 NOTE — Assessment & Plan Note (Signed)
We had a long talk about methods of treatment, pt reports she has never been on medication for this before. We discussed what hte medications do and how they can help. She relates a significant portion of her stress to her job and the lack of benefits. We discussed that helping to correct her sleep and get her into a good bedtime routine is something we can work on while she is considering the medications. I will see her back in about 4 months to reassess her symptoms.

## 2022-11-20 NOTE — Assessment & Plan Note (Signed)
I reviewed her blood work she had done recently with her GYN. Her A1C was 5.7. We had a long talk about reducing sugar and starches in her diet. I advised using an app on her phone to track her carbs and to kee her carbs less than 100 grams daily in order to help correct her blood sugars. I will recheck this in 3-6 months.

## 2022-11-20 NOTE — Assessment & Plan Note (Signed)
I advised that she take the cetirizine daily to help prevent any hives from occurring. I also have placed a referral to the allergist to have her retested since she is reporting worsening of her hives/ more foods are causing the hives now.

## 2022-11-20 NOTE — Progress Notes (Signed)
New Patient Office Visit  Subjective    Patient ID: Rachel Beck, female    DOB: 11/26/1995  Age: 27 y.o. MRN: [id number]  CC:  Chief Complaint  Patient presents with   Establish Care    HPI Rachel Beck presents to establish care Pt reports that she is having some problems with her shoulders on both sides, states that when she rolls her shoulders back or forward they sometimes "pop" or feel like a rubberband where they feel very "wound up". 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 States that she feels like it gets stuck and sometimes in a certain position and that when it feels like a rubber band. States that she has to roll her shoulders forward to make it better.  Pt reports that she has a history of food allergies, states that she breaks out into hives, states that she feels like it is getting worse, is reacting to more foods and is having more outbreaks more frequently.  Skin gets itching, very sensitive, throat gets tingly, but feels like it is closing up when she eats certain foods. Is only taking the cetirizine 10 mg as needed for the hives. We discussed that the medication would work better if she took it every day regardless of her symptoms.   I reviewed the patient's GAD and PHQ scores which are both elevated. She reports it has been this way for some time, since COVID and she has been working from home ever since. She reports that her job is very stressful and they to do not allow her to take PTO. She reports she sometimes has trouble sleeping at night, states she just feels wired at those times and doesn't feel sleepy Outpatient Encounter Medications as of 11/20/2022  Medication Sig   cetirizine (ZYRTEC ALLERGY) 10 MG tablet Take 1 tablet (10 mg total) by mouth daily  as needed for allergies.   EPINEPHrine 0.3 mg/0.3 mL IJ SOAJ injection Inject 0.3 mg into the muscle as needed for anaphylaxis.   famotidine (PEPCID) 20 MG tablet Take 1 tablet (20 mg total) by mouth 2 (two) times daily as needed for heartburn or indigestion.   Norethindrone Acetate-Ethinyl Estrad-FE (LOESTRIN 24 FE) 1-20 MG-MCG(24) tablet Take 1 tablet by mouth daily.   [DISCONTINUED] acetaminophen (TYLENOL) 500 MG tablet Take 1 tablet (500 mg total) by mouth every 6 (six) hours as needed. For use AFTER surgery   [DISCONTINUED] ibuprofen (ADVIL) 600 MG tablet Take 1 tablet (600 mg total) by mouth every 6 (six) hours as needed for mild pain or moderate pain. For use AFTER surgery (Patient not taking: Reported on 07/11/2021)   No facility-administered encounter medications on file as of 11/20/2022.    Past Medical History:  Diagnosis Date   Allergy    UTI (lower urinary tract infection)     Past Surgical History:  Procedure Laterality Date   BREAST REDUCTION SURGERY Bilateral 10/11/2020   Procedure: MAMMARY REDUCTION  (BREAST);  Surgeon: Cindra Presume, MD;  Location: [place];  Service: Plastics;  Laterality: Bilateral;   WISDOM TOOTH EXTRACTION      Family History  Problem Relation Age of Onset   Diabetes Father    Seizures Sister    Diabetes Paternal Grandmother    Diabetes  Paternal Grandfather    Cancer Cousin        leukemia    Social History   Socioeconomic History   Marital status: Single    Spouse name: Not on file   Number of children: Not on file   Years of education: Not on file   Highest education level: Not on file  Occupational History   Not on file  Tobacco Use   Smoking status: Never   Smokeless tobacco: Never  Vaping Use   Vaping Use: Former  Substance and Sexual Activity   Alcohol use: Yes    Comment: occasionally   Drug use: Never   Sexual activity: Yes    Birth control/protection: Pill  Other Topics Concern   Not on file   Social History Narrative   Exercises regularly      Part time school, working   Social Determinants of Radio broadcast assistant Strain: Not on file  Food Insecurity: Not on file  Transportation Needs: Not on file  Physical Activity: Not on file  Stress: Not on file  Social Connections: Not on file  Intimate Partner Violence: Not on file    Review of Systems  All other systems reviewed and are negative.       Objective    BP 102/78 (BP Location: Left Arm, Patient Position: Sitting, Cuff Size: Large)   Pulse 70   Temp 98.2 F (36.8 C) (Oral)   Ht 5' 2.5" (1.588 m)   Wt 188 lb 14.4 oz (85.7 kg)   LMP 10/19/2022 (Exact Date)   SpO2 98%   BMI 34.00 kg/m   Physical Exam Vitals reviewed.  Constitutional:      Appearance: Normal appearance. She is well-groomed. She is obese.  Eyes:     Conjunctiva/sclera: Conjunctivae normal.  Neck:     Thyroid: No thyromegaly.  Cardiovascular:     Rate and Rhythm: Normal rate and regular rhythm.     Pulses: Normal pulses.     Heart sounds: S1 normal and S2 normal.  Pulmonary:     Effort: Pulmonary effort is normal.     Breath sounds: Normal breath sounds and air entry.  Abdominal:     General: Bowel sounds are normal.  Musculoskeletal:     Right lower leg: No edema.     Left lower leg: No edema.  Neurological:     Mental Status: She is alert and oriented to person, place, and time. Mental status is at baseline.     Gait: Gait is intact.  Psychiatric:        Mood and Affect: Mood and affect normal.        Speech: Speech normal.        Behavior: Behavior normal.        Judgment: Judgment normal.     Last metabolic panel Lab Results  Component Value Date   GLUCOSE 85 09/08/2022   NA 136 09/08/2022   K 4.2 09/08/2022   CL 99 09/08/2022   CO2 23 09/08/2022   BUN 11 09/08/2022   CREATININE 0.74 09/08/2022   EGFR 114 09/08/2022   CALCIUM 9.5 09/08/2022   PROT 7.0 09/08/2022   ALBUMIN 4.4 09/08/2022   LABGLOB 2.6  09/08/2022   AGRATIO 1.7 09/08/2022   BILITOT 0.5 09/08/2022   ALKPHOS 110 09/08/2022   AST 14 09/08/2022   ALT 15 09/08/2022   ANIONGAP 11 04/26/2019   Last lipids Lab Results  Component Value Date   CHOL 170 09/08/2022  HDL 55 09/08/2022   LDLCALC 106 (H) 09/08/2022   TRIG 41 09/08/2022   CHOLHDL 3.1 09/08/2022   Last hemoglobin A1c Lab Results  Component Value Date   HGBA1C 5.7 (H) 09/08/2022        Assessment & Plan:   Problem List Items Addressed This Visit       Unprioritized   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 I also have placed a referral to the allergist to have her retested since she is reporting worsening of her hives/ more foods are causing the hives now.       Relevant Orders   Ambulatory referral to Allergy   Impaired fasting glucose    I reviewed her blood work she had done recently with her GYN. Her A1C was 5.7. We had a long talk about reducing sugar and starches in her diet. I advised using an app on her phone to track her carbs and to kee her carbs less than 100 grams daily in order to help correct her blood sugars. I will recheck this in 3-6 months.      Anxiety and depression    We had a long talk about methods of treatment, pt reports she has never been on medication for this before. We discussed what hte medications do and how they can help. She relates a significant portion of her stress to her job and the lack of benefits. We discussed that helping to correct her sleep and get her into a good bedtime routine is something we can work on while she is considering the medications. I will see her back in about 4 months to reassess her symptoms.       Return in about 4 months (around 03/22/2023) for follow up for repeat A1C.   Farrel Conners, MD

## 2023-01-03 ENCOUNTER — Ambulatory Visit: Payer: Self-pay | Admitting: Allergy

## 2023-02-15 ENCOUNTER — Encounter: Payer: Self-pay | Admitting: Allergy

## 2023-02-15 ENCOUNTER — Other Ambulatory Visit: Payer: Self-pay

## 2023-02-15 ENCOUNTER — Ambulatory Visit (INDEPENDENT_AMBULATORY_CARE_PROVIDER_SITE_OTHER): Payer: BC Managed Care – PPO | Admitting: Allergy

## 2023-02-15 VITALS — BP 126/82 | HR 77 | Temp 98.3°F | Resp 16 | Ht 71.0 in | Wt 191.7 lb

## 2023-02-15 DIAGNOSIS — L508 Other urticaria: Secondary | ICD-10-CM | POA: Diagnosis not present

## 2023-02-15 NOTE — Patient Instructions (Signed)
Chronic hives  - at this time etiology of hives and swelling has some identifiable trigger but also could be spontaneous at times.  Hives can be caused by a variety of different triggers including illness/infection, foods, medications, stings, exercise, pressure, vibrations, extremes of temperature to name a few however majority of the time there is no identifiable trigger.  Your symptoms have been ongoing for >6 weeks making this chronic thus will obtain labwork to evaluate: tryptase, hive panel, environmental panel, alpha-gal panel - your blood counts, liver/kidney function and thyroid levels were normal from Jan 2024.  - for hive management recommend use of antihistamine regimen of Zyrtec daily.  If daily dosing if not effective then increase to Zyrtec twice a day and add Pepcid 1 tab twice a day.   - if needed for hive control can increase therapy to add Xolair monthly injections. Will discuss in detail if needed in future - continue to avoid current food avoidance (tomato, mango, pineapple, kidney bean) and latex  - have access to self-injectable epinephrine (Epipen or AuviQ) 0.3mg  at all times - follow emergency action plan in case of allergic reaction  Follow-up in 3-4 months or sooner if needed

## 2023-02-15 NOTE — Progress Notes (Signed)
New Patient Note  RE: Rachel Beck MRN: [id number] DOB: 09-Dec-1995 Date of Office Visit: 02/15/2023   Primary care provider: Karie Georges, MD  Chief Complaint: Food allergy  History of present illness: Rachel Beck is a 27 y.o. female presenting today for evaluation of urticaria due to food allergy.  She states she has been having more hives lately and occurring couple times a month for the past 1.5. years.   She reports tomato ingestion she gets itchy everywhere and has little hive bumps that pop up.  She does her best to avoid tomato.   Mango and pineapple make her tongue feel like it is hairy and also has hives develop.  She feels kidney beans also seem to cause her to develop hives.  She states she bought some new clothes and didn't wash first and she wore them and broke out in hives.  Latex contact exposure she reports having hives as well.  When she has the hives she will take zyrtec as needed.  She states if she takes zyrtec before eating tomato then she won't have the hives.   She states very rarely the hives leave any bruising behind.  She has had lip swelling with the hives. No fever.  No preceding illnesses.  No change in soaps/detergents/body products. No bites/stings.  She states she has been having hives occasionally over the past decade.  She states she has seen an allergist 10 years or more ago and had allergy testing and states cockroaches were positive.   Review of systems: Review of Systems  Constitutional: Negative.   HENT: Negative.    Eyes: Negative.   Respiratory: Negative.    Cardiovascular: Negative.   Gastrointestinal: Negative.   Musculoskeletal: Negative.   Skin:  Positive for rash.  Allergic/Immunologic: Negative.   Neurological: Negative.     All other systems negative unless noted above in HPI  Past medical history: Past Medical History:  Diagnosis Date   Allergy    UTI (lower urinary tract infection)      Past surgical history: Past Surgical History:  Procedure Laterality Date   BREAST REDUCTION SURGERY Bilateral 10/11/2020   Procedure: MAMMARY REDUCTION  (BREAST);  Surgeon: Allena Napoleon, MD;  Location: [facility];  Service: Plastics;  Laterality: Bilateral;   WISDOM TOOTH EXTRACTION      Family history:  Family History  Problem Relation Age of Onset   Asthma Mother    Eczema Father    Diabetes Father    Asthma Sister    Seizures Sister    Diabetes Paternal Grandmother    Diabetes Paternal Grandfather    Cancer Cousin        leukemia    Social history: Lives in a apartment with carpeting in the bedroom with electric heating and central cooling.  Dog, turtles, fish, snake, bearded dragon, lizards in the home.  Dogs, cats outside the home.  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  No smoking history.    Medication List: Current Outpatient Medications  Medication Sig Dispense Refill   cetirizine (ZYRTEC ALLERGY) 10 MG tablet Take 1 tablet (10 mg total) by mouth daily as needed for allergies. 90 tablet 2   EPINEPHrine 0.3 mg/0.3 mL IJ SOAJ injection Inject 0.3 mg into the muscle as needed for anaphylaxis. 1 each 0   famotidine (PEPCID) 20 MG tablet Take 1 tablet (20 mg total) by mouth 2 (two) times daily as needed for heartburn or indigestion.  60 tablet 3   Norethindrone Acetate-Ethinyl Estrad-FE (LOESTRIN 24 FE) 1-20 MG-MCG(24) tablet Take 1 tablet by mouth daily. 84 tablet 3   No current facility-administered medications for this visit.    Known medication allergies: Allergies  Allergen Reactions   Latex     hives   Mango Flavor Itching and Swelling   Pineapple Itching and Swelling   Tomato Itching and Swelling     Physical examination: Blood pressure 126/82, pulse 77, temperature 98.3 F (36.8 C), temperature source Temporal, resp. rate 16, height 5\' 11"  (1.803 m), weight 191 lb 11.2 oz (87 kg), SpO2 98 %.  General: Alert,  interactive, in no acute distress. HEENT: PERRLA, TMs pearly gray, turbinates non-edematous without discharge, post-pharynx non erythematous. Neck: Supple without lymphadenopathy. Lungs: Clear to auscultation without wheezing, rhonchi or rales. {no increased work of breathing. CV: Normal S1, S2 without murmurs. Abdomen: Nondistended, nontender. Skin: Warm and dry, without lesions or rashes. Extremities:  No clubbing, cyanosis or edema. Neuro:   Grossly intact.  Diagnositics/Labs: Labs:  Component     Latest Ref Rng 09/08/2022  Glucose     70 - 99 mg/dL 85   BUN     6 - 20 mg/dL 11   Creatinine     9.14 - 1.00 mg/dL 7.82   eGFR     >95 AO/ZHY/8.65 114   BUN/Creatinine Ratio     9 - 23  15   Sodium     134 - 144 mmol/L 136   Potassium     3.5 - 5.2 mmol/L 4.2   Chloride     96 - 106 mmol/L 99   CO2     20 - 29 mmol/L 23   Calcium     8.7 - 10.2 mg/dL 9.5   Total Protein     6.0 - 8.5 g/dL 7.0   Albumin     4.0 - 5.0 g/dL 4.4   Globulin, Total     1.5 - 4.5 g/dL 2.6   Albumin/Globulin Ratio     1.2 - 2.2  1.7   Total Bilirubin     0.0 - 1.2 mg/dL 0.5   Alkaline Phosphatase     44 - 121 IU/L 110   AST     0 - 40 IU/L 14   ALT     0 - 32 IU/L 15   WBC     3.4 - 10.8 x10E3/uL 5.1   RBC     3.77 - 5.28 x10E6/uL 5.13   Hemoglobin     11.1 - 15.9 g/dL 78.4   HCT     69.6 - 29.5 % 43.3   MCV     79 - 97 fL 84   MCH     26.6 - 33.0 pg 28.7   MCHC     31.5 - 35.7 g/dL 28.4   RDW     13.2 - 44.0 % 13.0   Platelets     150 - 450 x10E3/uL 287   Cholesterol, Total     100 - 199 mg/dL 102   Triglycerides     0 - 149 mg/dL 41   HDL Cholesterol     >39 mg/dL 55   VLDL Cholesterol Cal     5 - 40 mg/dL 9   LDL Chol Calc (NIH)     0 - 99 mg/dL 725 (H)   Total CHOL/HDL Ratio     0.0 - 4.4 ratio 3.1   Hemoglobin A1C  4.8 - 5.6 % 5.7 (H)   Est. 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  Hives can be caused by a variety of different triggers including illness/infection, foods, medications, stings, exercise, pressure, vibrations, extremes of temperature to name a few however majority of the time there is no identifiable trigger.  Your symptoms have been ongoing for >6 weeks making this chronic thus will obtain labwork to evaluate: tryptase, hive panel, environmental panel, alpha-gal panel - your blood counts, liver/kidney function and thyroid levels were normal from Jan 2024.  - for hive management recommend use of antihistamine regimen of Zyrtec daily.  If daily dosing if not effective then increase to Zyrtec twice a day and add Pepcid 1 tab twice a day.   - if needed for hive control can increase therapy to add Xolair monthly injections. Will discuss in detail if needed in future - continue to avoid current food avoidance (tomato, mango, pineapple, kidney bean) and latex  - have access to self-injectable epinephrine (Epipen or AuviQ) 0.3mg  at all times - follow emergency action plan in case of allergic reaction  Follow-up in 3-4 months or sooner if needed  I appreciate the opportunity to take part in Shantrell's care. Please do not hesitate to contact me with questions.  Sincerely,   Margo Aye, MD Allergy/Immunology [facility]

## 2023-02-17 LAB — ALLERGEN, MANGO, F91

## 2023-02-18 LAB — ALLERGEN, KIDNEY BEAN, RF287: Kidney Bean IgE: <0.1 kU/L

## 2023-02-23 LAB — ALPHA-GAL PANEL
Allergen Lamb IgE: <0.1 kU/L
Beef IgE: <0.1 kU/L
IgE (Immunoglobulin E), Serum: 13 IU/mL (ref 6–495)
O215-IgE Alpha-Gal: <0.1 kU/L
Pork IgE: <0.1 kU/L

## 2023-02-23 LAB — ALLERGENS W/TOTAL IGE AREA 2
Alternaria Alternata IgE: <0.1 kU/L
Aspergillus Fumigatus IgE: <0.1 kU/L
Bermuda Grass IgE: <0.1 kU/L
Cat Dander IgE: <0.1 kU/L
Cedar, Mountain IgE: <0.1 kU/L
Cladosporium Herbarum IgE: <0.1 kU/L
Cockroach, German IgE: 1.7 kU/L — AB
Common Silver Birch IgE: <0.1 kU/L
Cottonwood IgE: <0.1 kU/L
D Farinae IgE: <0.1 kU/L
D Pteronyssinus IgE: <0.1 kU/L
Dog Dander IgE: <0.1 kU/L
Elm, American IgE: <0.1 kU/L
Johnson Grass IgE: <0.1 kU/L
Maple/Box Elder IgE: <0.1 kU/L
Mouse Urine IgE: <0.1 kU/L
Oak, White IgE: <0.1 kU/L
Pecan, Hickory IgE: <0.1 kU/L
Penicillium Chrysogen IgE: <0.1 kU/L
Pigweed, Rough IgE: <0.1 kU/L
Ragweed, Short IgE: <0.1 kU/L
Sheep Sorrel IgE Qn: <0.1 kU/L
Timothy Grass IgE: <0.1 kU/L
White Mulberry IgE: <0.1 kU/L

## 2023-02-23 LAB — ALLERGEN, TOMATO F25: Allergen Tomato, IgE: <0.1 kU/L

## 2023-02-23 LAB — THYROID ANTIBODIES
Thyroglobulin Antibody: <1 IU/mL (ref 0.0–0.9)
Thyroperoxidase Ab SerPl-aCnc: <9 IU/mL (ref 0–34)

## 2023-02-23 LAB — CHRONIC URTICARIA: cu index: <6.1 (ref <10)

## 2023-02-23 LAB — TRYPTASE: Tryptase: 5 ug/L (ref 2.2–13.2)

## 2023-02-23 LAB — ALLERGEN, PINEAPPLE, F210: Pineapple IgE: <0.1 kU/L

## 2023-02-23 LAB — SEDIMENTATION RATE: Sed Rate: 5 mm/hr (ref 0–32)

## 2023-02-23 LAB — ANA W/REFLEX: Anti Nuclear Antibody (ANA): NEGATIVE

## 2023-03-21 ENCOUNTER — Encounter (INDEPENDENT_AMBULATORY_CARE_PROVIDER_SITE_OTHER): Payer: Self-pay

## 2023-03-22 ENCOUNTER — Encounter: Payer: BC Managed Care – PPO | Admitting: Family Medicine

## 2023-04-11 ENCOUNTER — Encounter: Payer: BC Managed Care – PPO | Admitting: Family Medicine

## 2023-05-15 ENCOUNTER — Encounter: Payer: BC Managed Care – PPO | Admitting: Family Medicine

## 2023-05-16 ENCOUNTER — Ambulatory Visit: Payer: BC Managed Care – PPO | Admitting: Allergy

## 2023-06-19 ENCOUNTER — Encounter: Payer: Self-pay | Admitting: Family Medicine

## 2023-06-19 ENCOUNTER — Ambulatory Visit (INDEPENDENT_AMBULATORY_CARE_PROVIDER_SITE_OTHER): Payer: BC Managed Care – PPO | Admitting: Family Medicine

## 2023-06-19 VITALS — BP 108/80 | HR 75 | Temp 98.5°F | Ht 62.25 in | Wt 192.6 lb

## 2023-06-19 DIAGNOSIS — R7301 Impaired fasting glucose: Secondary | ICD-10-CM | POA: Diagnosis not present

## 2023-06-19 DIAGNOSIS — Z0000 Encounter for general adult medical examination without abnormal findings: Secondary | ICD-10-CM

## 2023-06-19 NOTE — Patient Instructions (Addendum)
Probiotics or active culture yogurt  Fiber-- 35 grams per day  1 scoop of miralax daily  Health Maintenance, Female Adopting a healthy lifestyle and getting preventive care are important in promoting health and wellness. Ask your health care provider about: The right schedule for you to have regular tests and exams. Things you can do on your own to prevent diseases and keep yourself healthy. What should I know about diet, weight, and exercise? Eat a healthy diet  Eat a diet that includes plenty of vegetables, fruits, low-fat dairy products, and lean protein. Do not eat a lot of foods that are high in solid fats, added sugars, or sodium. Maintain a healthy weight Body mass index (BMI) is used to identify weight problems. It estimates body fat based on height and weight. Your health care provider can help determine your BMI and help you achieve or maintain a healthy weight. Get regular exercise Get regular exercise. This is one of the most important things you can do for your health. Most adults should: Exercise for at least 150 minutes each week. The exercise should increase your heart rate and make you sweat (moderate-intensity exercise). Do strengthening exercises at least twice a week. This is in addition to the moderate-intensity exercise. Spend less time sitting. Even light physical activity can be beneficial. Watch cholesterol and blood lipids Have your blood tested for lipids and cholesterol at 28 years of age, then have this test every 5 years. Have your cholesterol levels checked more often if: Your lipid or cholesterol levels are high. You are older than 28 years of age. You are at high risk for heart disease. What should I know about cancer screening? Depending on your health history and family history, you may need to have cancer screening at various ages. This may include screening for: Breast cancer. Cervical cancer. Colorectal cancer. Skin cancer. Lung cancer. What  should I know about heart disease, diabetes, and high blood pressure? Blood pressure and heart disease High blood pressure causes heart disease and increases the risk of stroke. This is more likely to develop in people who have high blood pressure readings or are overweight. Have your blood pressure checked: Every 3-5 years if you are 59-20 years of age. Every year if you are 17 years old or older. Diabetes Have regular diabetes screenings. This checks your fasting blood sugar level. Have the screening done: Once every three years after age 58 if you are at a normal weight and have a low risk for diabetes. More often and at a younger age if you are overweight or have a high risk for diabetes. What should I know about preventing infection? Hepatitis B If you have a higher risk for hepatitis B, you should be screened for this virus. Talk with your health care provider to find out if you are at risk for hepatitis B infection. Hepatitis C Testing is recommended for: Everyone born from [birth year] through 1966. Anyone with known risk factors for hepatitis C. Sexually transmitted infections (STIs) Get screened for STIs, including gonorrhea and chlamydia, if: You are sexually active and are younger than 28 years of age. You are older than 29 years of age and your health care provider tells you that you are at risk for this type of infection. Your sexual activity has changed since you were last screened, and you are at increased risk for chlamydia or gonorrhea. Ask your health care provider if you are at risk. Ask your health care provider about whether you are  at high risk for HIV. Your health care provider may recommend a prescription medicine to help prevent HIV infection. If you choose to take medicine to prevent HIV, you should first get tested for HIV. You should then be tested every 3 months for as long as you are taking the medicine. Pregnancy If you are about to stop having your period  (premenopausal) and you may become pregnant, seek counseling before you get pregnant. Take 400 to 800 micrograms (mcg) of folic acid every day if you become pregnant. Ask for birth control (contraception) if you want to prevent pregnancy. Osteoporosis and menopause Osteoporosis is a disease in which the bones lose minerals and strength with aging. This can result in bone fractures. If you are 30 years old or older, or if you are at risk for osteoporosis and fractures, ask your health care provider if you should: Be screened for bone loss. Take a calcium or vitamin D supplement to lower your risk of fractures. Be given hormone replacement therapy (HRT) to treat symptoms of menopause. Follow these instructions at home: Alcohol use Do not drink alcohol if: Your health care provider tells you not to drink. You are pregnant, may be pregnant, or are planning to become pregnant. If you drink alcohol: Limit how much you have to: 0-1 drink a day. Know how much alcohol is in your drink. In the [place] one drink equals one 12 oz bottle of beer (355 mL), one 5 oz glass of wine (148 mL), or one 1 oz glass of hard liquor (44 mL). Lifestyle Do not use any products that contain nicotine or tobacco. These products include cigarettes, chewing tobacco, and vaping devices, such as e-cigarettes. If you need help quitting, ask your health care provider. Do not use street drugs. Do not share needles. Ask your health care provider for help if you need support or information about quitting drugs. General instructions Schedule regular health, dental, and eye exams. Stay current with your vaccines. Tell your health care provider if: You often feel depressed. You have ever been abused or do not feel safe at home. Summary Adopting a healthy lifestyle and getting preventive care are important in promoting health and wellness. Follow your health care provider's instructions about healthy diet, exercising, and getting  tested or screened for diseases. Follow your health care provider's instructions on monitoring your cholesterol and blood pressure. This information is not intended to replace advice given to you by your health care provider. Make sure you discuss any questions you have with your health care provider. Document Revised: 12/27/2020 Document Reviewed: 12/27/2020 Elsevier Patient Education  2024 [employer].

## 2023-06-19 NOTE — Progress Notes (Signed)
Complete physical exam  Patient: Rachel Beck   DOB: November 17, 1995   27 y.o. Female  MRN: [id number]  Subjective:    Chief Complaint  Patient presents with   Annual Exam    Frankie Lumbra is a 27 y.o. female who presents today for a complete physical exam. She reports consuming a general diet. The patient does not participate in regular exercise at present. She generally feels well. She reports sleeping fairly well. She does not have additional problems to discuss today.    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   EPINEPHrine 0.3 mg/0.3 mL IJ SOAJ injection Inject 0.3 mg into the muscle as needed for anaphylaxis.   famotidine (PEPCID) 20 MG tablet Take 1 tablet (20 mg total) by mouth 2 (two) times daily as needed for heartburn or indigestion.   Norethindrone Acetate-Ethinyl Estrad-FE (LOESTRIN 24 FE) 1-20 MG-MCG(24) tablet Take 1 tablet by mouth daily.   No facility-administered medications prior to visit.    Review of Systems   HENT:  Negative for hearing loss.   Eyes:  Negative for blurred vision.  Respiratory:  Negative for shortness of breath.   Cardiovascular:  Negative for chest pain.  Gastrointestinal: Negative.   Genitourinary: Negative.   Musculoskeletal:  Negative for back pain.  Neurological:  Negative for headaches.  Psychiatric/Behavioral:  Negative for depression.        Objective:     BP 108/80 (BP Location: Left Arm, Patient Position: Sitting, Cuff Size: Large)   Pulse 75   Temp 98.5 F (36.9 C) (Oral)   Ht 5' 2.25" (1.581 m)   Wt 192 lb 9.6 oz (87.4 kg)   LMP 05/29/2023 (Approximate)   SpO2 100%   BMI 34.94 kg/m    Physical Exam Vitals reviewed.  Constitutional:      Appearance: Normal appearance. She is well-groomed. She is obese.  HENT:     Right Ear: Tympanic membrane and ear canal normal.     Left Ear: Tympanic membrane and ear canal normal.     Mouth/Throat:     Mouth: Mucous membranes are moist.     Pharynx: No posterior oropharyngeal erythema.  Eyes:     Conjunctiva/sclera: Conjunctivae normal.  Neck:     Thyroid: No thyromegaly.  Cardiovascular:     Rate and Rhythm: Normal rate and regular rhythm.     Pulses: Normal pulses.  Heart sounds: S1 normal and S2 normal.  Pulmonary:     Effort: Pulmonary effort is normal.     Breath sounds: Normal breath sounds and air entry.  Abdominal:     General: Bowel sounds are normal.  Musculoskeletal:     Right lower leg: No edema.     Left lower leg: No edema.  Lymphadenopathy:     Cervical: No cervical adenopathy.  Neurological:     Mental Status: She is alert and oriented to person, place, and time. Mental status is at baseline.     Gait: Gait is intact.  Psychiatric:        Mood and Affect: Mood and affect normal.        Speech: Speech normal.        Behavior: Behavior normal.        Judgment: Judgment normal.      No results found for any visits on 06/19/23. Last metabolic panel Lab Results  Component  Value Date   GLUCOSE 85 09/08/2022   NA 136 09/08/2022   K 4.2 09/08/2022   CL 99 09/08/2022   CO2 23 09/08/2022   BUN 11 09/08/2022   CREATININE 0.74 09/08/2022   EGFR 114 09/08/2022   CALCIUM 9.5 09/08/2022   PROT 7.0 09/08/2022   ALBUMIN 4.4 09/08/2022   LABGLOB 2.6 09/08/2022   AGRATIO 1.7 09/08/2022   BILITOT 0.5 09/08/2022   ALKPHOS 110 09/08/2022   AST 14 09/08/2022   ALT 15 09/08/2022   ANIONGAP 11 04/26/2019   Last lipids Lab Results  Component Value Date   CHOL 170 09/08/2022   HDL 55 09/08/2022   LDLCALC 106 (H) 09/08/2022   TRIG 41 09/08/2022   CHOLHDL 3.1 09/08/2022        Assessment & Plan:    Routine Health Maintenance and Physical Exam  Immunization History  Administered Date(s) Administered   Tdap 08/21/2013    Health Maintenance  Topic Date Due   COVID-19 Vaccine (1 - 2023-24 season) 07/05/2023 (Originally 04/22/2023)   INFLUENZA VACCINE  11/19/2023 (Originally 03/22/2023)   DTaP/Tdap/Td (2 - Td or Tdap) 08/22/2023   Cervical Cancer Screening (Pap smear)  07/11/2024   Hepatitis C Screening  Completed   HIV Screening  Completed   HPV VACCINES  Aged Out    Discussed health benefits of physical activity, and encouraged her to engage in regular exercise appropriate for her age and condition.  Impaired fasting glucose -     Hemoglobin A1c  Routine general medical examination at a health care facility  Normal physical exam findings today, will check new A1C to surveillance. Counseled patient on bowel regimen and healthy sleep habits. Handouts given on healthy eating and exercise. Verbal counseling also provided on these topics today.   Return in 1 year (on 06/18/2024).     Karie Georges, MD

## 2023-06-20 LAB — HEMOGLOBIN A1C: Hgb A1c MFr Bld: 5.6 % (ref 4.6–6.5)

## 2023-09-24 ENCOUNTER — Telehealth: Payer: Self-pay

## 2023-09-24 NOTE — Telephone Encounter (Signed)
Patient called nurse line regarding her refill of medication loestrin 24 FE as it was not approved for refill. Patient stated that she needs a refill and if she needs to come in for a appointment.  Marcelino Duster, RN

## 2023-09-25 ENCOUNTER — Encounter: Payer: Self-pay | Admitting: Family Medicine

## 2023-09-25 DIAGNOSIS — Z3041 Encounter for surveillance of contraceptive pills: Secondary | ICD-10-CM

## 2023-09-25 MED ORDER — NORETHIN ACE-ETH ESTRAD-FE 1-20 MG-MCG(24) PO TABS
1.0000 | ORAL_TABLET | Freq: Every day | ORAL | 3 refills | Status: DC
Start: 2023-09-25 — End: 2023-11-15

## 2023-09-26 NOTE — Telephone Encounter (Signed)
 Per chart review, pt's Rx for Loestrin 24 FE was sent in yesterday by her PCP Dr. Ozell. I called pt and she verified this information. She requested to schedule appt for her Annual Gyn exam. She denies having any problems @ this time and would like to see Dr. Fredirick for a late afternoon appt on any Harvie Morua. I advised that I will send message to scheduling staff and she will be notified via Mychart. Pt voiced understanding.

## 2023-11-15 ENCOUNTER — Ambulatory Visit (INDEPENDENT_AMBULATORY_CARE_PROVIDER_SITE_OTHER): Payer: BC Managed Care – PPO | Admitting: Family Medicine

## 2023-11-15 ENCOUNTER — Encounter: Payer: Self-pay | Admitting: Family Medicine

## 2023-11-15 ENCOUNTER — Other Ambulatory Visit: Payer: Self-pay

## 2023-11-15 ENCOUNTER — Other Ambulatory Visit (HOSPITAL_COMMUNITY)
Admission: RE | Admit: 2023-11-15 | Discharge: 2023-11-15 | Disposition: A | Discharge status: Home / Self Care | Source: Ambulatory Visit | Attending: Family Medicine | Admitting: Family Medicine

## 2023-11-15 VITALS — BP 113/78 | HR 82 | Wt 183.1 lb

## 2023-11-15 DIAGNOSIS — Z3041 Encounter for surveillance of contraceptive pills: Secondary | ICD-10-CM | POA: Diagnosis not present

## 2023-11-15 DIAGNOSIS — Z124 Encounter for screening for malignant neoplasm of cervix: Secondary | ICD-10-CM | POA: Insufficient documentation

## 2023-11-15 DIAGNOSIS — Z23 Encounter for immunization: Secondary | ICD-10-CM

## 2023-11-15 DIAGNOSIS — F419 Anxiety disorder, unspecified: Secondary | ICD-10-CM

## 2023-11-15 DIAGNOSIS — Z01419 Encounter for gynecological examination (general) (routine) without abnormal findings: Secondary | ICD-10-CM

## 2023-11-15 DIAGNOSIS — F32A Depression, unspecified: Secondary | ICD-10-CM

## 2023-11-15 MED ORDER — NORETHIN ACE-ETH ESTRAD-FE 1-20 MG-MCG(24) PO TABS
1.0000 | ORAL_TABLET | Freq: Every day | ORAL | 3 refills | Status: AC
Start: 2023-11-15 — End: ?

## 2023-11-15 NOTE — Assessment & Plan Note (Addendum)
 [address] - Discussed options with patient. This includes sleep hygiene, referral for testing with psychologist to determine, PTSD, ADHD, GAD or the like.  Discussed mindfulness, meditation and long term consequences of poor sleep on all aspects of life.  43 minutes were taken with pt, discussing this and reviewing previous records, labs, and notes as well as note writing.  May need intermittent FMLA if stressors become too much and cannot find relief from testing and IBH referral.

## 2023-11-15 NOTE — Progress Notes (Signed)
 Subjective:     Rachel Beck is a 28 y.o. female and is here for a comprehensive physical exam. The patient reports problems - insomnia. Reports early am awakening and poor sleep hygiene. She has racing thoughts. She reports getting overwhelmed from stress and anxiety at work. She is reluctant to take meds due to side effects. Her COCs are working well for her. Has not had a lot of reflux and has not needed her H2 blocker.  The following portions of the patient's history were reviewed and updated as appropriate: allergies, current medications, past family history, past medical history, past social history, past surgical history, and problem list.  Review of Systems Pertinent items noted in HPI and remainder of comprehensive ROS otherwise negative.   Objective:  Chaperone present for exam   BP 113/78   Pulse 82   Wt 183 lb 1.6 oz (83.1 kg)   LMP 10/25/2023 (Approximate)   BMI 33.22 kg/m  General appearance: alert, cooperative, and appears stated age Head: Normocephalic, without obvious abnormality, atraumatic Neck: no adenopathy, supple, symmetrical, trachea midline, and thyroid not enlarged, symmetric, no tenderness/mass/nodules Lungs: clear to auscultation bilaterally Breasts: normal appearance, no masses or tenderness Heart: regular rate and rhythm, S1, S2 normal, no murmur, click, rub or gallop Abdomen: soft, non-tender; bowel sounds normal; no masses,  no organomegaly Pelvic: cervix normal in appearance, external genitalia normal, no adnexal masses or tenderness, no cervical motion tenderness, uterus normal size, shape, and consistency, and vagina normal without discharge Extremities: extremities normal, atraumatic, no cyanosis or edema Pulses: 2+ and symmetric Skin: Skin color, texture, turgor normal. No rashes or lesions Lymph nodes: Cervical, supraclavicular, and axillary nodes normal. Neurologic: Grossly normal    Assessment:    Healthy female exam.      Plan:      See After Visit Summary for Counseling Recommendations

## 2023-11-15 NOTE — Patient Instructions (Signed)

## 2023-11-16 ENCOUNTER — Encounter: Payer: Self-pay | Admitting: Family Medicine

## 2023-11-16 LAB — COMPREHENSIVE METABOLIC PANEL WITH GFR
ALT: 47 IU/L — ABNORMAL HIGH (ref 0–32)
AST: 35 IU/L (ref 0–40)
Albumin: 4.6 g/dL (ref 4.0–5.0)
Alkaline Phosphatase: 97 IU/L (ref 44–121)
BUN/Creatinine Ratio: 14 (ref 9–23)
BUN: 14 mg/dL (ref 6–20)
Bilirubin Total: 0.3 mg/dL (ref 0.0–1.2)
CO2: 22 mmol/L (ref 20–29)
Calcium: 9.5 mg/dL (ref 8.7–10.2)
Chloride: 103 mmol/L (ref 96–106)
Creatinine, Ser: 0.97 mg/dL (ref 0.57–1.00)
Globulin, Total: 2.6 g/dL (ref 1.5–4.5)
Glucose: 95 mg/dL (ref 70–99)
Potassium: 4.1 mmol/L (ref 3.5–5.2)
Sodium: 140 mmol/L (ref 134–144)
Total Protein: 7.2 g/dL (ref 6.0–8.5)
eGFR: 82 mL/min/{1.73_m2} (ref >59)

## 2023-11-16 LAB — LIPID PANEL
Chol/HDL Ratio: 3.4 ratio (ref 0.0–4.4)
Cholesterol, Total: 200 mg/dL — ABNORMAL HIGH (ref 100–199)
HDL: 59 mg/dL (ref >39)
LDL Chol Calc (NIH): 133 mg/dL — ABNORMAL HIGH (ref 0–99)
Triglycerides: 41 mg/dL (ref 0–149)
VLDL Cholesterol Cal: 8 mg/dL (ref 5–40)

## 2023-11-16 LAB — CBC
Hematocrit: 43.7 % (ref 34.0–46.6)
Hemoglobin: 14.5 g/dL (ref 11.1–15.9)
MCH: 29 pg (ref 26.6–33.0)
MCHC: 33.2 g/dL (ref 31.5–35.7)
MCV: 87 fL (ref 79–97)
Platelets: 281 10*3/uL (ref 150–450)
RBC: 5 x10E6/uL (ref 3.77–5.28)
RDW: 13.6 % (ref 11.7–15.4)
WBC: 5.3 10*3/uL (ref 3.4–10.8)

## 2023-11-16 LAB — TSH: TSH: 1 u[IU]/mL (ref 0.450–4.500)

## 2023-11-16 LAB — HEMOGLOBIN A1C
Est. average glucose Bld gHb Est-mCnc: 111 mg/dL
Hgb A1c MFr Bld: 5.5 % (ref 4.8–5.6)

## 2023-11-20 LAB — CYTOLOGY - PAP
Adequacy: ABSENT
Comment: NEGATIVE
Diagnosis: NEGATIVE
High risk HPV: NEGATIVE

## 2023-12-03 ENCOUNTER — Telehealth: Payer: Self-pay | Admitting: Clinical

## 2023-12-03 NOTE — Telephone Encounter (Signed)
Attempt call regarding referral; Unable to leave message as mailbox has not been set up.

## 2023-12-13 ENCOUNTER — Other Ambulatory Visit

## 2023-12-13 ENCOUNTER — Other Ambulatory Visit: Payer: Self-pay

## 2023-12-13 DIAGNOSIS — Z01419 Encounter for gynecological examination (general) (routine) without abnormal findings: Secondary | ICD-10-CM | POA: Diagnosis not present

## 2023-12-14 LAB — COMPREHENSIVE METABOLIC PANEL WITH GFR
ALT: 23 IU/L (ref 0–32)
AST: 72 IU/L — ABNORMAL HIGH (ref 0–40)
Albumin: 4.2 g/dL (ref 4.0–5.0)
Alkaline Phosphatase: 90 IU/L (ref 44–121)
BUN/Creatinine Ratio: 8 — ABNORMAL LOW (ref 9–23)
BUN: 6 mg/dL (ref 6–20)
Bilirubin Total: 0.8 mg/dL (ref 0.0–1.2)
CO2: 21 mmol/L (ref 20–29)
Calcium: 8.9 mg/dL (ref 8.7–10.2)
Chloride: 100 mmol/L (ref 96–106)
Creatinine, Ser: 0.8 mg/dL (ref 0.57–1.00)
Globulin, Total: 2.5 g/dL (ref 1.5–4.5)
Glucose: 67 mg/dL — ABNORMAL LOW (ref 70–99)
Potassium: 3.6 mmol/L (ref 3.5–5.2)
Sodium: 137 mmol/L (ref 134–144)
Total Protein: 6.7 g/dL (ref 6.0–8.5)
eGFR: 103 mL/min/{1.73_m2} (ref >59)

## 2023-12-17 ENCOUNTER — Telehealth: Payer: Self-pay

## 2023-12-17 ENCOUNTER — Other Ambulatory Visit: Payer: Self-pay | Admitting: Family Medicine

## 2023-12-17 ENCOUNTER — Encounter: Payer: Self-pay | Admitting: Family Medicine

## 2023-12-17 DIAGNOSIS — R748 Abnormal levels of other serum enzymes: Secondary | ICD-10-CM

## 2023-12-17 NOTE — Progress Notes (Signed)
 U/s for abnormal LFT's

## 2023-12-17 NOTE — Telephone Encounter (Addendum)
-----   Message from Granville Layer sent at 12/17/2023  8:27 AM EDT ----- Please check GGT, Hepatitis panel, serum iron, TIBC and RUQ u/s. Thanks.  Patient informed of request from provider to come in for labs and have U/S.  Pt agreed to US  on 01/02/24 and requests to have labs drawn after at 1030 at [facility].  Pt scheduled per her request.  Labs placed as future order.   Ahley Bulls,RN

## 2023-12-20 ENCOUNTER — Telehealth: Payer: Self-pay | Admitting: *Deleted

## 2023-12-20 NOTE — Telephone Encounter (Signed)
 Requested by Dr. Adriana Hopping to reach out to patient to see what she is needing for FMLA. I called [name] and she confirms with her job it is very difficult to get time off and had to schedule US  based on when she could get time off. States last time with medical issues almost lost her job. Requesting short term FMLA so she can get the care she  needs and not lose her job. I informed her to send her FMLA forms and Dr. Adriana Hopping has approved short term FMLA. She voices understanding. Rachel Beck

## 2023-12-21 ENCOUNTER — Telehealth: Payer: Self-pay | Admitting: Family Medicine

## 2023-12-21 NOTE — Telephone Encounter (Signed)
 Attempted to contact the patient regarding her FMLA paperwork. Noted from Stana Ear, RN that Dr. Adriana Hopping has approved the necessary FMLA for her to attend her appointments.

## 2023-12-27 ENCOUNTER — Other Ambulatory Visit: Payer: Self-pay | Admitting: Family Medicine

## 2023-12-27 DIAGNOSIS — R7989 Other specified abnormal findings of blood chemistry: Secondary | ICD-10-CM

## 2024-01-02 ENCOUNTER — Ambulatory Visit
Admission: RE | Admit: 2024-01-02 | Discharge: 2024-01-02 | Disposition: A | Discharge status: Home / Self Care | Source: Ambulatory Visit | Attending: Family Medicine | Admitting: Family Medicine

## 2024-01-02 ENCOUNTER — Ambulatory Visit: Payer: Self-pay | Admitting: Family Medicine

## 2024-01-02 ENCOUNTER — Other Ambulatory Visit

## 2024-01-02 DIAGNOSIS — R7989 Other specified abnormal findings of blood chemistry: Secondary | ICD-10-CM

## 2024-01-02 DIAGNOSIS — K769 Liver disease, unspecified: Secondary | ICD-10-CM | POA: Diagnosis not present

## 2024-01-02 DIAGNOSIS — R748 Abnormal levels of other serum enzymes: Secondary | ICD-10-CM

## 2024-01-02 DIAGNOSIS — K7581 Nonalcoholic steatohepatitis (NASH): Secondary | ICD-10-CM | POA: Insufficient documentation

## 2024-01-03 LAB — IRON AND TIBC
Iron Saturation: 20 % (ref 15–55)
Iron: 74 ug/dL (ref 27–159)
Total Iron Binding Capacity: 368 ug/dL (ref 250–450)
UIBC: 294 ug/dL (ref 131–425)

## 2024-01-03 LAB — ACUTE HEP PANEL AND HEP B SURFACE AB
Hep A IgM: NEGATIVE
Hep B C IgM: NEGATIVE
Hep C Virus Ab: NONREACTIVE
Hepatitis B Surf Ab Quant: 388 m[IU]/mL
Hepatitis B Surface Ag: NEGATIVE

## 2024-01-03 LAB — GAMMA GT: GGT: 17 IU/L (ref 0–60)

## 2024-01-11 NOTE — Progress Notes (Signed)
 Pt agreed to US  on 01/02/24.  Raya Mckinstry,RN

## 2024-01-31 ENCOUNTER — Encounter: Payer: Self-pay | Admitting: Clinical

## 2024-01-31 NOTE — BH Specialist Note (Deleted)
 Integrated Behavioral Health via Telemedicine Visit  01/31/2024 Rachel Beck [id number]  Number of Integrated Behavioral Health Clinician visits: No data recorded Session Start time: No data recorded  Session End time: No data recorded Total time in minutes: No data recorded   Referring Provider: Tiffany Foerster, MD Patient/Family location: Home*** [facility] Provider location: [facility]  All persons participating in visit: Patient Rachel Beck and Rachel Beck ***  Types of Service: {CHL AMB TYPE OF SERVICE:[phone]}  I connected with Lindsay Rho and/or Deborrah Fam Rosenkranz's {family members:20773} via  Telephone or Video Enabled Telemedicine Application  (Video is Caregility application) and verified that I am speaking with the correct person using two identifiers. Discussed confidentiality: Yes   I discussed the limitations of telemedicine and the availability of in person appointments.  Discussed there is a possibility of technology failure and discussed alternative modes of communication if that failure occurs.  I discussed that engaging in this telemedicine visit, they consent to the provision of behavioral healthcare and the services will be billed under their insurance.  Patient and/or legal guardian expressed understanding and consented to Telemedicine visit: Yes   Presenting Concerns: Patient and/or family reports the following symptoms/concerns: *** Duration of problem: ***; Severity of problem: {Mild/Moderate/Severe:20260}  Patient and/or Family's Strengths/Protective Factors: {CHL AMB BH PROTECTIVE FACTORS:[phone]}  Goals Addressed: Patient will:  Reduce symptoms of: {IBH Symptoms:21014056}   Increase knowledge and/or ability of: {IBH Patient Tools:21014057}   Demonstrate ability to: {IBH Goals:21014053}  Progress towards Goals: {CHL AMB BH PROGRESS TOWARDS  GOALS:[phone]}    Interventions: Interventions utilized:  {IBH Interventions:21014054} Standardized Assessments completed: {IBH Screening Tools:21014051}    Patient and/or Family Response: Patient agrees with treatment plan. ***  Clinical Assessment/Diagnosis  No diagnosis found. .   Patient may benefit from psychoeducation and brief therapeutic interventions regarding coping with symptoms of *** .  Plan: Follow up with behavioral health clinician on : *** Behavioral recommendations:  -*** -*** Referral(s): {IBH Referrals:21014055}  I discussed the assessment and treatment plan with the patient and/or parent/guardian. They were provided an opportunity to ask questions and all were answered. They agreed with the plan and demonstrated an understanding of the instructions.   They were advised to call back or seek an in-person evaluation if the symptoms worsen or if the condition fails to improve as anticipated.  Georgia Kipper, LCSW     11/15/2023    8:55 AM 06/19/2023    2:13 PM 11/20/2022   10:22 AM 09/08/2022   11:07 AM 04/07/2020    1:46 PM  Depression screen PHQ 2/9  Decreased Interest 2 1 2  0 1  Down, Depressed, Hopeless 2 1 2  0 0  PHQ - 2 Score 4 2 4  0 1  Altered sleeping 3 0 2 1 2   Tired, decreased energy 2 0 1 1 0  Change in appetite 2 1 2 1 2   Feeling bad or failure about yourself  1 0 1 0 0  Trouble concentrating 2 0 1 0 0  Moving slowly or fidgety/restless 1 1 1  0 0  Suicidal thoughts 1 0 1 0 0  PHQ-9 Score 16 4 13 3 5   Difficult doing work/chores   Somewhat difficult        11/15/2023    8:55 AM 11/20/2022   10:23 AM 09/08/2022   11:08 AM 04/07/2020    1:46 PM  GAD 7 : Generalized Anxiety Score  Nervous, Anxious, on  Edge 3 2 1  0  Control/stop worrying 2 2 0 0  Worry too much - different things 2 2 0 0  Trouble relaxing 2 1 0 0  Restless 2 1 0 0  Easily annoyed or irritable 2 3 2  0  Afraid - awful might happen 2 2 2  0  Total GAD 7 Score 15 13 5   0  Anxiety Difficulty  Somewhat difficult

## 2024-02-11 ENCOUNTER — Other Ambulatory Visit (INDEPENDENT_AMBULATORY_CARE_PROVIDER_SITE_OTHER)

## 2024-02-11 ENCOUNTER — Ambulatory Visit: Admitting: Gastroenterology

## 2024-02-11 ENCOUNTER — Encounter: Payer: Self-pay | Admitting: Gastroenterology

## 2024-02-11 VITALS — BP 98/60 | HR 79 | Ht 62.25 in | Wt 179.8 lb

## 2024-02-11 DIAGNOSIS — K760 Fatty (change of) liver, not elsewhere classified: Secondary | ICD-10-CM

## 2024-02-11 DIAGNOSIS — R143 Flatulence: Secondary | ICD-10-CM | POA: Diagnosis not present

## 2024-02-11 DIAGNOSIS — R7989 Other specified abnormal findings of blood chemistry: Secondary | ICD-10-CM

## 2024-02-11 DIAGNOSIS — K219 Gastro-esophageal reflux disease without esophagitis: Secondary | ICD-10-CM | POA: Diagnosis not present

## 2024-02-11 LAB — HEPATIC FUNCTION PANEL
ALT: 12 U/L (ref 0–35)
AST: 11 U/L (ref 0–37)
Albumin: 4.1 g/dL (ref 3.5–5.2)
Alkaline Phosphatase: 74 U/L (ref 39–117)
Bilirubin, Direct: 0.1 mg/dL (ref 0.0–0.3)
Total Bilirubin: 0.5 mg/dL (ref 0.2–1.2)
Total Protein: 6.9 g/dL (ref 6.0–8.3)

## 2024-02-11 LAB — PROTIME-INR
INR: 1.4 ratio — ABNORMAL HIGH (ref 0.8–1.0)
Prothrombin Time: 14.9 s — ABNORMAL HIGH (ref 9.6–13.1)

## 2024-02-11 LAB — FERRITIN: Ferritin: 25.5 ng/mL (ref 10.0–291.0)

## 2024-02-11 MED ORDER — FAMOTIDINE 20 MG PO TABS: 20.0000 mg | ORAL_TABLET | Freq: Two times a day (BID) | ORAL | 3 refills | Status: AC | PRN

## 2024-02-11 NOTE — Patient Instructions (Addendum)
 Fatty liver -Recommend Mediterranean diet -Maintain healthy weight -Exercise as tolerated -Avoid Alcohol   Gas OTC simethicone or GAS-x as needed Recommend low fodmap diet Take your time eating, chew food thoroughly  Heartburn -GERD diet no late meals 3-4 hours before lying down -Can take pepcid  20 mg twice daily - take 1 tab 30 minutes before meal breakfast and/or dinner  Your provider has requested that you go to the basement level for lab work before leaving today. Press B on the elevator. The lab is located at the first door on the left as you exit the elevator.  _______________________________________________________  If your blood pressure at your visit was 140/90 or greater, please contact your primary care physician to follow up on this.  _______________________________________________________  If you are age 72 or older, your body mass index should be between 23-30. Your Body mass index is 32.62 kg/m. If this is out of the aforementioned range listed, please consider follow up with your Primary Care Provider.  If you are age 60 or younger, your body mass index should be between 19-25. Your Body mass index is 32.62 kg/m. If this is out of the aformentioned range listed, please consider follow up with your Primary Care Provider.   ________________________________________________________  The [facility] providers would like to encourage you to use MYCHART to communicate with providers for non-urgent requests or questions.  Due to long hold times on the telephone, sending your provider a message by [facility] may be a faster and more efficient way to get a response.  Please allow 48 business hours for a response.  Please remember that this is for non-urgent requests.  _______________________________________________________  Thank you for trusting me with your gastrointestinal care. Deanna May, RNP

## 2024-02-11 NOTE — Progress Notes (Signed)
 Chief Complaint:Nonalcoholic steatohepatitis (NASH)  Primary GI Doctor: Dr. San   HPI:  Patient is a 28 year old female patient with past medical history of GERD and seasonal allergies, who was referred to me by Ozell Heron HERO, MD on 01/02/24 for a complaint of Nonalcoholic steatohepatitis (NASH) .    Interval History     Patient presents for evaluation of fatty liver. No personal history of fatty liver.  She had slightly elevated LFT's on annual lab work with follow-up abd ultrasound that showed fatty liver. Patient denies new medications. Patient denies herbal supplements. Patient denies Tylenol  use. No recent viral illness or sickness. She reports she stopped drinking March 2024. She reports she was drinking 1-2 glasses few days a week.     Patient has pyrosis several days a week. She was prescribed famotidine  20 mg but admits she has not been taking it. She does consume a lot of spicy food.     Her bowel habits are regular when she consumes high fiber diet. She reports a lot of gas. She does not drink carbonated drinks or use straws. She does admit she eats quickly at times.      She has had some lower back pain she mentions that lasts short period of time. She thinks Delford Wingert be related to her exercise or prolonged sitting. She does have standing desk and walks during lunch.   Patient has lost 13 lbs since October 2024. She states she has changed her diet and started regular exercise. She use to eat out more but has been trying to cook more meals at home.  No family history of GI issues or CA.   Wt Readings from Last 3 Encounters:  02/11/24 179 lb 12.8 oz (81.6 kg)  11/15/23 183 lb 1.6 oz (83.1 kg)  06/19/23 192 lb 9.6 oz (87.4 kg)   Past Medical History:  Diagnosis Date   Allergy    GERD (gastroesophageal reflux disease)    UTI (lower urinary tract infection)     Past Surgical History:  Procedure Laterality Date   BREAST REDUCTION SURGERY Bilateral 10/11/2020    Procedure: MAMMARY REDUCTION  (BREAST);  Surgeon: Elisabeth Craig RAMAN, MD;  Location: [facility];  Service: Plastics;  Laterality: Bilateral;   WISDOM TOOTH EXTRACTION      Current Outpatient Medications  Medication Sig Dispense Refill   cetirizine  (ZYRTEC  ALLERGY) 10 MG tablet Take 1 tablet (10 mg total) by mouth daily as needed for allergies. 90 tablet 2   EPINEPHrine  0.3 mg/0.3 mL IJ SOAJ injection Inject 0.3 mg into the muscle as needed for anaphylaxis. 1 each 0   Norethindrone Acetate-Ethinyl Estrad-FE (LOESTRIN 24 FE) 1-20 MG-MCG(24) tablet Take 1 tablet by mouth daily. 84 tablet 3   famotidine  (PEPCID ) 20 MG tablet Take 1 tablet (20 mg total) by mouth 2 (two) times daily as needed for heartburn or indigestion. 60 tablet 3   No current facility-administered medications for this visit.    Allergies as of 02/11/2024 - Review Complete 02/11/2024  Allergen Reaction Noted   Latex  06/19/2019   Mango flavoring agent (non-screening) Itching and Swelling 10/11/2020   Pineapple Itching and Swelling 10/11/2020   Tomato Itching and Swelling 10/11/2020    Family History  Problem Relation Age of Onset   Asthma Mother    Eczema Father    Diabetes Father    Asthma Sister    Seizures Sister    Diabetes Paternal Grandmother    Diabetes Paternal Actor  Cancer Cousin        leukemia   Review of Systems:    Constitutional: No weight loss, fever, chills, weakness or fatigue HEENT: Eyes: No change in vision               Ears, Nose, Throat:  No change in hearing or congestion Skin: No rash or itching Cardiovascular: No chest pain, chest pressure or palpitations   Respiratory: No SOB or cough Gastrointestinal: See HPI and otherwise negative Genitourinary: No dysuria or change in urinary frequency Neurological: No headache, dizziness or syncope Musculoskeletal: No new muscle or joint pain Hematologic: No bleeding or bruising Psychiatric: No history of depression or  anxiety    Physical Exam:  Vital signs: BP 98/60   Pulse 79   Ht 5' 2.25 (1.581 m)   Wt 179 lb 12.8 oz (81.6 kg)   LMP 01/16/2024 (Exact Date)   BMI 32.62 kg/m   Constitutional:   Pleasant female appears to be in NAD, Well developed, Well nourished, alert and cooperative Throat: Oral cavity and pharynx without inflammation, swelling or lesion.  Respiratory: Respirations even and unlabored. Lungs clear to auscultation bilaterally.   No wheezes, crackles, or rhonchi.  Cardiovascular: Normal S1, S2. Regular rate and rhythm. No peripheral edema, cyanosis or pallor.  Gastrointestinal:  Soft, nondistended, nontender. No rebound or guarding. Normal bowel sounds. No appreciable masses or hepatomegaly. Rectal:  Not performed.  Msk:  Symmetrical without gross deformities. Without edema, no deformity or joint abnormality.  Neurologic:  Alert and  oriented x4;  grossly normal neurologically.  Skin:   Dry and intact without significant lesions or rashes. Psychiatric: Oriented to person, place and time. Demonstrates good judgement and reason without abnormal affect or behaviors.  RELEVANT LABS AND IMAGING: CBC    Latest Ref Rng & Units 11/15/2023    9:55 AM 09/08/2022   12:09 PM 04/26/2019   12:41 PM  CBC  WBC 3.4 - 10.8 x10E3/uL 5.3  5.1  4.6   Hemoglobin 11.1 - 15.9 g/dL 85.4  85.2  88.3   Hematocrit 34.0 - 46.6 % 43.7  43.3  35.8   Platelets 150 - 450 x10E3/uL 281  287  280      CMP     Latest Ref Rng & Units 12/13/2023    3:48 PM 11/15/2023    9:55 AM 09/08/2022   12:09 PM  CMP  Glucose 70 - 99 mg/dL 67  95  85   BUN 6 - 20 mg/dL 6  14  11    Creatinine 0.57 - 1.00 mg/dL 9.19  9.02  9.25   Sodium 134 - 144 mmol/L 137  140  136   Potassium 3.5 - 5.2 mmol/L 3.6  4.1  4.2   Chloride 96 - 106 mmol/L 100  103  99   CO2 20 - 29 mmol/L 21  22  23    Calcium 8.7 - 10.2 mg/dL 8.9  9.5  9.5   Total Protein 6.0 - 8.5 g/dL 6.7  7.2  7.0   Total Bilirubin 0.0 - 1.2 mg/dL 0.8  0.3  0.5    Alkaline Phos 44 - 121 IU/L 90  97  110   AST 0 - 40 IU/L 72  35  14   ALT 0 - 32 IU/L 23  47  15      Lab Results  Component Value Date   TSH 1.000 11/15/2023   Lab Results  Component Value Date   IRON 74 01/02/2024  TIBC 368 01/02/2024  01/02/24 labs show: GGT 17, acute hepatitis panel negative 02/15/23 ANA negative, sed rate 5, alpha gal panel negative   01/02/24 ABD US  complete IMPRESSION: 1. No acute abnormality identified. 2. Coarsened increased echotexture of the liver. This is nonspecific but can be seen in fatty infiltration of liver.  Fibrosis 4 Score = 1.55 Fib-4 interpretation is not validated for people under 35 or over 22 years of age. However, scores under 2.0 are generally considered low risk.   Assessment: Encounter Diagnoses  Name Primary?   Gastroesophageal reflux disease without esophagitis Yes   Flatulence    Elevated LFTs    Fatty liver      28 year old female patient who [presents for evaluation of slightly elevated LFT's and fatty liver.  GGT 17, acute hepatitis panel negative, ANA negative slightly elevated AST 72 (April) and ALT 47 (March) both normal Jan 2024. Abd U/S with no gallstones. Will check additional labs to rule out genetic disorders or autoimmune hepatitis. Also check PT/INR. FIB 4 score less than 2 which is considered low risk. Pt consumes alcohol, few drinks a week but has stopped since March. She has started to make dietary changes and lost 13 lbs in past 6 mths. She has incorporated exercise into routine as well. Prediabetic last year but has improve her Ha1c and decreased it down to 5.5. We spent several minutes discussing dietary changes, weight loss, and exercise as tolerated.    For the intermittent GERD we discussed GERD diet and using the famotidine  once to twice daily. Refilled prescription that was expired. Weight loss will also help with this.   Plan: - Check AMA, Anti-smooth muscle antibody, ferritin,  Alpha 1 antitrypsin,  ceruloplasmin, tTG, total IgA, PT/INR, IgG, hepatic panel -Recommend Mediterranean diet and maintain healthy weight -Exercise as tolerated -Start Pepcid  20mg  po twice daily prn -GERD diet, no late meals 3-4 hours before lying down -Low fodmap diet  -OTC simethicone or Gas- x for gas  Thank you for the courtesy of this consult. Please call me with any questions or concerns.   Arelys Glassco, FNP-C [facility] 02/11/2024, 2:06 PM  Cc: Ozell Heron HERO, MD

## 2024-02-14 LAB — MITOCHONDRIAL ANTIBODIES: Mitochondrial M2 Ab, IgG: <=20 U (ref <=20.0)

## 2024-02-14 LAB — ANTI-SMOOTH MUSCLE ANTIBODY, IGG: Actin (Smooth Muscle) Antibody (IGG): <20 U (ref <20)

## 2024-02-14 LAB — IGG: IgG (Immunoglobin G), Serum: 1123 mg/dL (ref 600–1640)

## 2024-02-14 LAB — CERULOPLASMIN: Ceruloplasmin: 57 mg/dL — ABNORMAL HIGH (ref 14–48)

## 2024-02-14 LAB — IGA: Immunoglobulin A: 82 mg/dL (ref 47–310)

## 2024-02-14 LAB — TISSUE TRANSGLUTAMINASE ABS,IGG,IGA
(tTG) Ab, IgA: <1 U/mL
(tTG) Ab, IgG: <1 U/mL

## 2024-02-14 LAB — ALPHA-1-ANTITRYPSIN: A-1 Antitrypsin, Ser: 225 mg/dL — ABNORMAL HIGH (ref 83–199)

## 2024-02-18 ENCOUNTER — Ambulatory Visit: Payer: Self-pay | Admitting: Gastroenterology

## 2024-02-20 NOTE — Progress Notes (Signed)
 Agree with the assessment and plan as outlined by [facility], FNP-C.  Rachel Bottino, DO, [facility]

## 2024-02-25 ENCOUNTER — Ambulatory Visit: Admitting: Physician Assistant

## 2024-02-26 ENCOUNTER — Telehealth: Payer: Self-pay

## 2024-02-26 ENCOUNTER — Other Ambulatory Visit: Payer: Self-pay

## 2024-02-26 DIAGNOSIS — K760 Fatty (change of) liver, not elsewhere classified: Secondary | ICD-10-CM

## 2024-02-26 DIAGNOSIS — R7989 Other specified abnormal findings of blood chemistry: Secondary | ICD-10-CM

## 2024-02-26 NOTE — Telephone Encounter (Signed)
 Call to discuss repeat labs in 4-6 weeks

## 2024-03-17 ENCOUNTER — Other Ambulatory Visit: Payer: Self-pay

## 2024-03-17 ENCOUNTER — Other Ambulatory Visit (INDEPENDENT_AMBULATORY_CARE_PROVIDER_SITE_OTHER)

## 2024-03-17 ENCOUNTER — Ambulatory Visit: Payer: Self-pay | Admitting: Clinical

## 2024-03-17 DIAGNOSIS — F419 Anxiety disorder, unspecified: Secondary | ICD-10-CM

## 2024-03-17 DIAGNOSIS — R7989 Other specified abnormal findings of blood chemistry: Secondary | ICD-10-CM

## 2024-03-17 DIAGNOSIS — K760 Fatty (change of) liver, not elsewhere classified: Secondary | ICD-10-CM

## 2024-03-17 LAB — PROTIME-INR
INR: 1.3 ratio — ABNORMAL HIGH (ref 0.8–1.0)
Prothrombin Time: 13.9 s — ABNORMAL HIGH (ref 9.6–13.1)

## 2024-03-17 LAB — APTT: aPTT: 28.2 s (ref 25.4–36.8)

## 2024-03-17 NOTE — Patient Instructions (Signed)
 Center for [facility] at [facility] [address] [place], [place] [address] [phone] (main office) [phone] (Konner Saiz's office)

## 2024-03-17 NOTE — BH Specialist Note (Signed)
 Integrated Behavioral Health via Telemedicine Visit  03/17/2024 Marcea Rojek [id number]  Number of Integrated Behavioral Health Clinician visits: 1- Initial Visit  Session Start time: 1545   Session End time: 1635  Total time in minutes: 50    Referring Provider: Glenys Birk, MD Patient/Family location: [facility]  [facility] Provider location: Patient Wilma Michaelson and [facility] Warren Mering   All persons participating in visit: Patient agrees with treatment plan.  Types of Service: Individual psychotherapy and Video visit  I connected with Thornell Terrea Goldmann and/or Thornell Terrea Alonge's n/a via  Telephone or Video Enabled Telemedicine Application  (Video is Caregility application) and verified that I am speaking with the correct person using two identifiers. Discussed confidentiality: Yes   I discussed the limitations of telemedicine and the availability of in person appointments.  Discussed there is a possibility of technology failure and discussed alternative modes of communication if that failure occurs.  I discussed that engaging in this telemedicine visit, they consent to the provision of behavioral healthcare and the services will be billed under their insurance.  Patient and/or legal guardian expressed understanding and consented to Telemedicine visit: Yes   Presenting Concerns: Patient and/or family reports the following symptoms/concerns: Anxiety and panic lasting up to two hours; attributes to work stress; copes with deep breathing exercises and time in solitude until it passes, as well as support at home via fiance and pet dog; open to implementing self-coping strategies today.  Duration of problem: Increasing over time; Severity of problem: moderately severe  Patient and/or Family's Strengths/Protective Factors: Social connections, Concrete supports in place (healthy food, safe environments, etc.),  Sense of purpose, and Physical Health (exercise, healthy diet, medication compliance, etc.)  Goals Addressed: Patient will:  Reduce symptoms of: anxiety, depression, and stress   Increase knowledge and/or ability of: self-management skills   Demonstrate ability to: Increase healthy adjustment to current life circumstances and Increase motivation to adhere to plan of care  Progress towards Goals: Ongoing    Interventions: Interventions utilized:  Mindfulness or Management consultant, Psychoeducation and/or Health Education, and Supportive Reflection Standardized Assessments completed: Not Needed    Patient and/or Family Response: Patient agrees with treatment plan.   Clinical Assessment/Diagnosis  Anxiety disorder, unspecified type    Patient may benefit from psychoeducation and brief therapeutic interventions regarding coping with symptoms of depression, anxiety, life stress .  Plan: Follow up with behavioral health clinician on : Three weeks Behavioral recommendations:  -CALM relaxation breathing exercise twice daily (morning; at bedtime with sleep sounds); as needed throughout the day. Begin Worry Time strategy, as discussed. Start by setting up start and end time reminders on phone today; continue daily for two weeks. -Continue using work breaks to physically get away from work space, as much as able; using deep breathing exercises when needed Referral(s): Integrated [employer] (In Clinic)  I discussed the assessment and treatment plan with the patient and/or parent/guardian. They were provided an opportunity to ask questions and all were answered. They agreed with the plan and demonstrated an understanding of the instructions.   They were advised to call back or seek an in-person evaluation if the symptoms worsen or if the condition fails to improve as anticipated.  Warren JAYSON Mering, LCSW     11/15/2023    8:55 AM 06/19/2023    2:13 PM 11/20/2022   10:22  AM 09/08/2022   11:07 AM 04/07/2020    1:46 PM  Depression  screen PHQ 2/9  Decreased Interest 2 1 2  0 1  Down, Depressed, Hopeless 2 1 2  0 0  PHQ - 2 Score 4 2 4  0 1  Altered sleeping 3 0 2 1 2   Tired, decreased energy 2 0 1 1 0  Change in appetite 2 1 2 1 2   Feeling bad or failure about yourself  1 0 1 0 0  Trouble concentrating 2 0 1 0 0  Moving slowly or fidgety/restless 1 1 1  0 0  Suicidal thoughts 1 0 1 0 0  PHQ-9 Score 16 4 13 3 5   Difficult doing work/chores   Somewhat difficult        11/15/2023    8:55 AM 11/20/2022   10:23 AM 09/08/2022   11:08 AM 04/07/2020    1:46 PM  GAD 7 : Generalized Anxiety Score  Nervous, Anxious, on Edge 3 2 1  0  Control/stop worrying 2 2 0 0  Worry too much - different things 2 2 0 0  Trouble relaxing 2 1 0 0  Restless 2 1 0 0  Easily annoyed or irritable 2 3 2  0  Afraid - awful might happen 2 2 2  0  Total GAD 7 Score 15 13 5  0  Anxiety Difficulty  Somewhat difficult

## 2024-03-18 ENCOUNTER — Encounter: Payer: Self-pay | Admitting: Family Medicine

## 2024-03-18 LAB — VITAMIN D 25 HYDROXY (VIT D DEFICIENCY, FRACTURES): VITD: 20.95 ng/mL — ABNORMAL LOW (ref 30.00–100.00)

## 2024-03-21 LAB — VITAMIN E
Gamma-Tocopherol (Vit E): 1 mg/L (ref <4.4)
Vitamin E (Alpha Tocopherol): 12.5 mg/L (ref 5.7–19.9)

## 2024-03-21 LAB — VITAMIN A: Vitamin A (Retinoic Acid): 41 ug/dL (ref 38–98)

## 2024-03-27 ENCOUNTER — Other Ambulatory Visit: Payer: Self-pay

## 2024-03-27 DIAGNOSIS — R899 Unspecified abnormal finding in specimens from other organs, systems and tissues: Secondary | ICD-10-CM

## 2024-03-27 NOTE — Progress Notes (Signed)
 referral

## 2024-05-13 ENCOUNTER — Encounter: Payer: Self-pay | Admitting: Gastroenterology

## 2024-05-15 ENCOUNTER — Inpatient Hospital Stay

## 2024-05-15 ENCOUNTER — Inpatient Hospital Stay: Attending: Oncology | Admitting: Oncology

## 2024-05-15 ENCOUNTER — Encounter: Payer: Self-pay | Admitting: Oncology

## 2024-05-15 VITALS — BP 110/74 | HR 88 | Temp 98.0°F | Resp 18 | Ht 62.0 in | Wt 177.2 lb

## 2024-05-15 DIAGNOSIS — R791 Abnormal coagulation profile: Secondary | ICD-10-CM | POA: Diagnosis not present

## 2024-05-15 DIAGNOSIS — R7989 Other specified abnormal findings of blood chemistry: Secondary | ICD-10-CM | POA: Insufficient documentation

## 2024-05-15 LAB — CBC WITH DIFFERENTIAL (CANCER CENTER ONLY)
Abs Immature Granulocytes: 0.01 K/uL (ref 0.00–0.07)
Basophils Absolute: 0 K/uL (ref 0.0–0.1)
Basophils Relative: 0 %
Eosinophils Absolute: 0 K/uL (ref 0.0–0.5)
Eosinophils Relative: 0 %
HCT: 40.8 % (ref 36.0–46.0)
Hemoglobin: 13.8 g/dL (ref 12.0–15.0)
Immature Granulocytes: 0 %
Lymphocytes Relative: 36 %
Lymphs Abs: 1.8 K/uL (ref 0.7–4.0)
MCH: 28.6 pg (ref 26.0–34.0)
MCHC: 33.8 g/dL (ref 30.0–36.0)
MCV: 84.5 fL (ref 80.0–100.0)
Monocytes Absolute: 0.4 K/uL (ref 0.1–1.0)
Monocytes Relative: 7 %
Neutro Abs: 2.8 K/uL (ref 1.7–7.7)
Neutrophils Relative %: 57 %
Platelet Count: 262 K/uL (ref 150–400)
RBC: 4.83 MIL/uL (ref 3.87–5.11)
RDW: 13 % (ref 11.5–15.5)
WBC Count: 5.1 K/uL (ref 4.0–10.5)
nRBC: 0 % (ref 0.0–0.2)

## 2024-05-15 LAB — CMP (CANCER CENTER ONLY)
ALT: 16 U/L (ref 0–44)
AST: 18 U/L (ref 15–41)
Albumin: 4.2 g/dL (ref 3.5–5.0)
Alkaline Phosphatase: 93 U/L (ref 38–126)
Anion gap: 5 (ref 5–15)
BUN: 12 mg/dL (ref 6–20)
CO2: 28 mmol/L (ref 22–32)
Calcium: 9.2 mg/dL (ref 8.9–10.3)
Chloride: 106 mmol/L (ref 98–111)
Creatinine: 0.84 mg/dL (ref 0.44–1.00)
GFR, Estimated: >60 mL/min (ref >60)
Glucose, Bld: 84 mg/dL (ref 70–99)
Potassium: 3.9 mmol/L (ref 3.5–5.1)
Sodium: 139 mmol/L (ref 135–145)
Total Bilirubin: 0.5 mg/dL (ref 0.0–1.2)
Total Protein: 7.3 g/dL (ref 6.5–8.1)

## 2024-05-15 LAB — PROTIME-INR
INR: 1.1 (ref 0.8–1.2)
Prothrombin Time: 14.7 s (ref 11.4–15.2)

## 2024-05-15 LAB — APTT: aPTT: 29 s (ref 24–36)

## 2024-05-15 LAB — FIBRINOGEN: Fibrinogen: 494 mg/dL — ABNORMAL HIGH (ref 210–475)

## 2024-05-15 NOTE — Assessment & Plan Note (Signed)
 In March and April 2025, she had slightly elevated AST and ALT levels.  Abdominal ultrasound on 01/02/2024 showed coarsened increased echotexture of the liver, nonspecific but suggestive of fatty infiltration of liver.  This led to GI referral.    She had initial consultation with GI Dr. San on 02/11/2024.  As part of workup for fatty liver, PT/INR was checked in addition to alpha 1 antitrypsin, ceruloplasmin, anti-smooth muscle antibody, ferritin etc.  INR was increased at 1.4, prothrombin time slightly increased at 14.9 seconds.  Repeat labs on 03/17/2024 showed INR of 1.3, prothrombin time slightly increased at 13.9 seconds (range 9.6-13.1). PTT was normal at 28.2.   Given persistent elevation in PT/INR with normal PTT, referral was sent to us  for further evaluation.  No symptoms of unusual bleeding or bruising and no family history of bleeding disorders.   Prolonged PT/INR may be related to underlying liver issues. Further investigation needed to rule out protein deficiencies such as factor VII deficiency.  Today we checked CBCD, CMP, which were both unremarkable.  PT, PTT, fibrinogen , factor VII level and prothrombin mixing study were performed today.  Will follow-up on these results.  - Call her with results in a couple of weeks.  Plan to see her again in 4 months for follow-up.  She was provided reassurance.

## 2024-05-15 NOTE — Progress Notes (Signed)
 [facility]  HEMATOLOGY CLINIC CONSULTATION NOTE   PATIENT NAME: Rachel Beck   MR#: [id number] DOB: February 24, 1996  DATE OF SERVICE: 05/15/2024   REFERRING PROVIDER  Deanna May, NP  Patient Care Team: Ozell Heron HERO, MD as PCP - General (Family Medicine) May, Deanna J, NP as Nurse Practitioner (Gastroenterology) San Sandor GAILS, DO as Consulting Physician (Gastroenterology)   REASON FOR CONSULTATION/ CHIEF COMPLAINT:  Elevated PT/INR, normal PTT  ASSESSMENT & PLAN:  Rachel Beck is a 28 y.o. lady with a past medical history of GERD, currently undergoing GI workup for nonalcoholic steatohepatitis (NASH), was referred to our service for evaluation of elevated PT/INR with normal PTT.    Prolonged pt (prothrombin time) In March and April 2025, she had slightly elevated AST and ALT levels.  Abdominal ultrasound on 01/02/2024 showed coarsened increased echotexture of the liver, nonspecific but suggestive of fatty infiltration of liver.  This led to GI referral.    She had initial consultation with GI Dr. San on 02/11/2024.  As part of workup for fatty liver, PT/INR was checked in addition to alpha 1 antitrypsin, ceruloplasmin, anti-smooth muscle antibody, ferritin etc.  INR was increased at 1.4, prothrombin time slightly increased at 14.9 seconds.  Repeat labs on 03/17/2024 showed INR of 1.3, prothrombin time slightly increased at 13.9 seconds (range 9.6-13.1). PTT was normal at 28.2.   Given persistent elevation in PT/INR with normal PTT, referral was sent to us  for further evaluation.  No symptoms of unusual bleeding or bruising and no family history of bleeding disorders.   Prolonged PT/INR may be related to underlying liver issues. Further investigation needed to rule out protein deficiencies such as factor VII deficiency.  Today we checked CBCD, CMP, which were both unremarkable.  PT, PTT, fibrinogen , factor VII level and  prothrombin mixing study were performed today.  Will follow-up on these results.  - Call her with results in a couple of weeks.  Plan to see her again in 4 months for follow-up.  She was provided reassurance.  Abnormal liver function tests and suspected fatty liver disease Liver function tests showed elevated AST and ALT in March and April, but current levels are normal. Suspected fatty liver disease with no family history of liver problems. Abnormal liver function may contribute to prolonged PT/INR. - Order blood tests including blood counts, chemistries, and INR.  I reviewed lab results and outside records for this visit and discussed relevant results with the patient. Diagnosis, plan of care and treatment options were also discussed in detail with the patient. Opportunity provided to ask questions and answers provided to her apparent satisfaction. Provided instructions to call our clinic with any problems, questions or concerns prior to return visit. I recommended to continue follow-up with PCP and sub-specialists. She verbalized understanding and agreed with the plan. No barriers to learning was detected.  Rachel Patten, MD  05/15/2024 12:22 PM  [facility] - A DEPT OF [facility] [address] [place] [address] Dept: [phone] Dept Fax: [phone]   HISTORY OF PRESENT ILLNESS:   Discussed the use of AI scribe software for clinical note transcription with the patient, who gave verbal consent to proceed.  History of Present Illness Rachel Beck is a 28 year old female who presents with abnormal liver function tests and coagulation studies.  In March and April 2025, she had slightly elevated AST and ALT levels.  Abdominal ultrasound on 01/02/2024 showed coarsened increased echotexture of the liver, nonspecific but suggestive of fatty infiltration of liver.  This led to GI referral.    She had initial  consultation with GI Dr. San on 02/11/2024.  As part of workup for fatty liver, PT/INR was checked in addition to alpha 1 antitrypsin, ceruloplasmin, anti-smooth muscle antibody, ferritin etc.  INR was increased at 1.4, prothrombin time slightly increased at 14.9 seconds.  Repeat labs on 03/17/2024 showed INR of 1.3, prothrombin time slightly increased at 13.9 seconds (range 9.6-13.1). PTT was normal at 28.2.   Given persistent elevation in PT/INR with normal PTT, referral was sent to us  for further evaluation.  No unusual bleeding, such as blood in stools, black stools, gum bleeding, or excessive bruising.  There is no family history of blood clots or bleeding problems, although her mother experiences slightly prolonged bleeding from cuts. She does not routinely take Tylenol  or other pain medications and has not consumed alcohol since April 2025.   MEDICAL HISTORY Past Medical History:  Diagnosis Date   Allergy    GERD (gastroesophageal reflux disease)    UTI (lower urinary tract infection)      SURGICAL HISTORY Past Surgical History:  Procedure Laterality Date   BREAST REDUCTION SURGERY Bilateral 10/11/2020   Procedure: MAMMARY REDUCTION  (BREAST);  Surgeon: Rachel Craig RAMAN, MD;  Location: [facility];  Service: Plastics;  Laterality: Bilateral;   WISDOM TOOTH EXTRACTION       SOCIAL HISTORY: She reports that she has never smoked. She has never been exposed to tobacco smoke. She has never used smokeless tobacco. She reports that she does not currently use alcohol. She reports that she does not use drugs. Social History   Socioeconomic History   Marital status: Single    Spouse name: Not on file   Number of children: Not on file   Years of education: Not on file   Highest education level: Not on file  Occupational History   Not on file  Tobacco Use   Smoking status: Never    Passive exposure: Never   Smokeless tobacco: Never  Vaping Use   Vaping status:  Former  Substance and Sexual Activity   Alcohol use: Not Currently    Comment: occasionally   Drug use: Never   Sexual activity: Yes    Birth control/protection: Pill  Other Topics Concern   Not on file  Social History Narrative   Exercises regularly      Part time school, working   Social Drivers of Corporate investment banker Strain: Not on file  Food Insecurity: Food Insecurity Present (11/15/2023)   Hunger Vital Sign    Worried About Running Out of Food in the Last Year: Sometimes true    Ran Out of Food in the Last Year: Never true  Transportation Needs: Unmet Transportation Needs (11/15/2023)   PRAPARE - Administrator, Civil Service (Medical): No    Lack of Transportation (Non-Medical): Yes  Physical Activity: Not on file  Stress: Not on file  Social Connections: Not on file  Intimate Partner Violence: Not on file    FAMILY HISTORY: Her family history includes Asthma in her mother and sister; Cancer in her cousin; Diabetes in her father, paternal grandfather, and paternal grandmother; Eczema in her father; Seizures in her sister.  CURRENT MEDICATIONS   Current Outpatient Medications  Medication Instructions   cetirizine  (ZYRTEC  ALLERGY) 10 mg, Oral, Daily PRN   EPINEPHrine  (EPI-PEN)  0.3 mg, Intramuscular, As needed   famotidine  (PEPCID ) 20 mg, Oral, 2 times daily PRN   Norethindrone Acetate-Ethinyl Estrad-FE (LOESTRIN 24 FE) 1-20 MG-MCG(24) tablet 1 tablet, Oral, Daily     ALLERGIES  She is allergic to latex, mango flavoring agent (non-screening), pineapple, and tomato.  REVIEW OF SYSTEMS:  Review of Systems - Oncology   Rest of the pertinent review of systems is unremarkable except as mentioned above in HPI.  PHYSICAL EXAMINATION:    Onc Performance Status - 05/15/24 1046       ECOG Perf Status   ECOG Perf Status Fully active, able to carry on all pre-disease performance without restriction      KPS SCALE   KPS % SCORE Normal, no  compliants, no evidence of disease          Vitals:   05/15/24 1051  BP: 110/74  Pulse: 88  Resp: 18  Temp: 98 F (36.7 C)  SpO2: 100%   Filed Weights   05/15/24 1051  Weight: 177 lb 3.2 oz (80.4 kg)    Physical Exam Constitutional:      General: She is not in acute distress.    Appearance: Normal appearance.  HENT:     Head: Normocephalic and atraumatic.  Cardiovascular:     Rate and Rhythm: Normal rate.  Pulmonary:     Effort: Pulmonary effort is normal. No respiratory distress.  Abdominal:     General: There is no distension.  Neurological:     General: No focal deficit present.     Mental Status: She is alert and oriented to person, place, and time.  Psychiatric:        Mood and Affect: Mood normal.        Behavior: Behavior normal.     LABORATORY DATA:   I have reviewed the data as listed.  Results for orders placed or performed in visit on 05/15/24  CBC with Differential (Cancer Center Only)  Result Value Ref Range   WBC Count 5.1 4.0 - 10.5 K/uL   RBC 4.83 3.87 - 5.11 MIL/uL   Hemoglobin 13.8 12.0 - 15.0 g/dL   HCT 59.1 63.9 - 53.9 %   MCV 84.5 80.0 - 100.0 fL   MCH 28.6 26.0 - 34.0 pg   MCHC 33.8 30.0 - 36.0 g/dL   RDW 86.9 88.4 - 84.4 %   Platelet Count 262 150 - 400 K/uL   nRBC 0.0 0.0 - 0.2 %   Neutrophils Relative % 57 %   Neutro Abs 2.8 1.7 - 7.7 K/uL   Lymphocytes Relative 36 %   Lymphs Abs 1.8 0.7 - 4.0 K/uL   Monocytes Relative 7 %   Monocytes Absolute 0.4 0.1 - 1.0 K/uL   Eosinophils Relative 0 %   Eosinophils Absolute 0.0 0.0 - 0.5 K/uL   Basophils Relative 0 %   Basophils Absolute 0.0 0.0 - 0.1 K/uL   Immature Granulocytes 0 %   Abs Immature Granulocytes 0.01 0.00 - 0.07 K/uL  CMP (Cancer Center only)  Result Value Ref Range   Sodium 139 135 - 145 mmol/L   Potassium 3.9 3.5 - 5.1 mmol/L   Chloride 106 98 - 111 mmol/L   CO2 28 22 - 32 mmol/L   Glucose, Bld 84 70 - 99 mg/dL   BUN 12 6 - 20 mg/dL   Creatinine 9.15 9.55 -  1.00 mg/dL   Calcium 9.2 8.9 - 89.6 mg/dL   Total Protein 7.3 6.5 - 8.1 g/dL  Albumin 4.2 3.5 - 5.0 g/dL   AST 18 15 - 41 U/L   ALT 16 0 - 44 U/L   Alkaline Phosphatase 93 38 - 126 U/L   Total Bilirubin 0.5 0.0 - 1.2 mg/dL   GFR, Estimated >39 >39 mL/min   Anion gap 5 5 - 15     RADIOGRAPHIC STUDIES:  No pertinent imaging studies available to review.  Orders Placed This Encounter  Procedures   CBC with Differential (Cancer Center Only)    Standing Status:   Future    Number of Occurrences:   1    Expiration Date:   05/15/2025   CMP (Cancer Center only)    Standing Status:   Future    Number of Occurrences:   1    Expiration Date:   05/15/2025   Protime-INR    Standing Status:   Future    Number of Occurrences:   1    Expiration Date:   05/15/2025   APTT    Standing Status:   Future    Number of Occurrences:   1    Expiration Date:   05/15/2025   Fibrinogen     Standing Status:   Future    Number of Occurrences:   1    Expiration Date:   05/15/2025   Factor 7 assay    Standing Status:   Future    Number of Occurrences:   1    Expiration Date:   05/15/2025   PT factor inhibitor (mixing study)    Standing Status:   Future    Number of Occurrences:   1    Expiration Date:   05/15/2025    Future Appointments  Date Time Provider Department Center  05/28/2024  3:15 PM Kellan Raffield, [place], MD [facility] None  09/19/2024  3:00 PM [facility] None  09/19/2024  3:30 PM Kaiana Marion, MD [facility] None     I spent a total of 55 minutes during this encounter with the patient including review of chart and various tests results, discussions about plan of care and coordination of care plan.  This document was completed utilizing speech recognition software. Grammatical errors, random word insertions, pronoun errors, and incomplete sentences are an occasional consequence of this system due to software limitations, ambient noise, and hardware issues. Any formal  questions or concerns about the content, text or information contained within the body of this dictation should be directly addressed to the provider for clarification.

## 2024-05-18 LAB — FACTOR 7 ASSAY: Factor VII Activity: 35 % — ABNORMAL LOW (ref 51–186)

## 2024-05-19 LAB — PT FACTOR INHIBITOR (MIXING STUDY)
PT 1:1NP: 10.8 s (ref 9.1–12.0)
PT: 12.4 s — ABNORMAL HIGH (ref 9.1–12.0)

## 2024-05-28 ENCOUNTER — Inpatient Hospital Stay: Attending: Oncology | Admitting: Oncology

## 2024-05-28 ENCOUNTER — Encounter: Payer: Self-pay | Admitting: Oncology

## 2024-05-28 DIAGNOSIS — R791 Abnormal coagulation profile: Secondary | ICD-10-CM | POA: Diagnosis not present

## 2024-05-28 NOTE — Assessment & Plan Note (Addendum)
 In March and April 2025, she had slightly elevated AST and ALT levels.  Abdominal ultrasound on 01/02/2024 showed coarsened increased echotexture of the liver, nonspecific but suggestive of fatty infiltration of liver.  This led to GI referral.    She had initial consultation with GI Dr. San on 02/11/2024.  As part of workup for fatty liver, PT/INR was checked in addition to alpha 1 antitrypsin, ceruloplasmin, anti-smooth muscle antibody, ferritin etc.  INR was increased at 1.4, prothrombin time slightly increased at 14.9 seconds.  Repeat labs on 03/17/2024 showed INR of 1.3, prothrombin time slightly increased at 13.9 seconds (range 9.6-13.1). PTT was normal at 28.2.   Given persistent elevation in PT/INR with normal PTT, referral was sent to us  for further evaluation.  No symptoms of unusual bleeding or bruising and no family history of bleeding disorders.   On her consultation with us  on 05/15/2024, CBCD, CMP were unremarkable.  Prothrombin time was normal based on our lab system at 14.7 seconds.  INR was normal at 1.1.  PTT was also normal at 29 seconds.  Fibrinogen  was borderline high at 494 mg/dL.  Factor VII activity was decreased at 35% (range 51 to 186%).  On mixing studies, minimally extended prothrombin time corrected into the normal range on 1:1 mix with normal plasma as would be expected with deficiency of one or more extrinsic pathway factors (VII, X, V, II) or fibrinogen . Diminished extrinsic pathway factor levels can be seen in liver disease and vitamin K deficiency or antagonism.  Intermittently prolonged PT/INR may be related to underlying liver issues.   She was advised to increase dietary intake of vitamin K-rich foods such as kale, spinach, collard greens, turnip greens, Brussel sprouts, broccoli, and asparagus to support factor VII production.   Plan to see her again in 4 months for follow-up.  She was provided reassurance.

## 2024-05-28 NOTE — Progress Notes (Signed)
 [facility]  HEMATOLOGY-ONCOLOGY ELECTRONIC VISIT PROGRESS NOTE  PATIENT NAME: Rachel Beck   MR#: [id number] DOB: 02-14-1996  DATE OF SERVICE: 05/28/2024  Patient Care Team: Ozell Heron HERO, MD as PCP - General (Family Medicine) May, Deanna J, NP as Nurse Practitioner (Gastroenterology) San Sandor GAILS, DO as Consulting Physician (Gastroenterology)  I connected with the patient via telephone conference and verified that I am speaking with the correct person using two identifiers. The patient's location is at home and I am providing care from the [facility].  I discussed the limitations, risks, security and privacy concerns of performing an evaluation and management service by e-visits and the availability of in person appointments. I also discussed with the patient that there may be a patient responsible charge related to this service. The patient expressed understanding and agreed to proceed.   ASSESSMENT & PLAN:   Rachel Beck is a 28 y.o. lady with a past medical history of GERD, currently undergoing GI workup for nonalcoholic steatohepatitis (NASH), was referred to our service for evaluation of elevated PT/INR with normal PTT.    Prolonged pt (prothrombin time) In March and April 2025, she had slightly elevated AST and ALT levels.  Abdominal ultrasound on 01/02/2024 showed coarsened increased echotexture of the liver, nonspecific but suggestive of fatty infiltration of liver.  This led to GI referral.    She had initial consultation with GI Dr. San on 02/11/2024.  As part of workup for fatty liver, PT/INR was checked in addition to alpha 1 antitrypsin, ceruloplasmin, anti-smooth muscle antibody, ferritin etc.  INR was increased at 1.4, prothrombin time slightly increased at 14.9 seconds.  Repeat labs on 03/17/2024 showed INR of 1.3, prothrombin time slightly increased at 13.9 seconds (range 9.6-13.1). PTT was normal at 28.2.   Given  persistent elevation in PT/INR with normal PTT, referral was sent to us  for further evaluation.  No symptoms of unusual bleeding or bruising and no family history of bleeding disorders.   On her consultation with us  on 05/15/2024, CBCD, CMP were unremarkable.  Prothrombin time was normal based on our lab system at 14.7 seconds.  INR was normal at 1.1.  PTT was also normal at 29 seconds.  Fibrinogen  was borderline high at 494 mg/dL.  Factor VII activity was decreased at 35% (range 51 to 186%).  On mixing studies, minimally extended prothrombin time corrected into the normal range on 1:1 mix with normal plasma as would be expected with deficiency of one or more extrinsic pathway factors (VII, X, V, II) or fibrinogen . Diminished extrinsic pathway factor levels can be seen in liver disease and vitamin K deficiency or antagonism.  Intermittently prolonged PT/INR may be related to underlying liver issues.   She was advised to increase dietary intake of vitamin K-rich foods such as kale, spinach, collard greens, turnip greens, Brussel sprouts, broccoli, and asparagus to support factor VII production.   Plan to see her again in 4 months for follow-up.  She was provided reassurance.   I discussed the assessment and treatment plan with the patient. The patient was provided an opportunity to ask questions and all were answered. The patient agreed with the plan and demonstrated an understanding of the instructions. The patient was advised to call back or seek an in-person evaluation if the symptoms worsen or if the condition fails to improve as anticipated.    I spent 12 minutes over the phone with the patient reviewing test results, discuss management and coordination/planning of  care.  Chinita Patten, MD 05/28/2024 3:43 PM [facility] - [facility] DEPT [facility] [address] [place] [address] Dept: [phone] Dept Fax: [phone]    INTERVAL HISTORY:  Please see above for problem oriented charting.  The purpose of today's discussion is to explain recent lab results and to formulate plan of care.  Discussed the use of AI scribe software for clinical note transcription with the patient, who gave verbal consent to proceed.  History of Present Illness Rachel Beck is a 28 year old female with a history of liver issues who presents for follow-up of abnormal coagulation studies.  She underwent a workup a couple of weeks ago which included blood counts, kidney and liver function tests, and coagulation studies. All blood counts were normal, as were kidney and liver function tests. The INR was 1.1, which is within normal limits. However, the prothrombin time was slightly elevated at 14.7 seconds, with the upper limit being 15.2 seconds.  A factor VII activity test showed a decreased level of 35%, with normal values ranging from 51 to 186%. A mixing study was performed as part of the evaluation. She is currently seeing a gastroenterologist for further evaluation of her liver condition.  She reports that she usually does not eat greens. No bleeding or bruising issues.   SUMMARY OF HEMATOLOGY HISTORY:  In March and April 2025, she had slightly elevated AST and ALT levels.  Abdominal ultrasound on 01/02/2024 showed coarsened increased echotexture of the liver, nonspecific but suggestive of fatty infiltration of liver.  This led to GI referral.     She had initial consultation with GI Dr. San on 02/11/2024.  As part of workup for fatty liver, PT/INR was checked in addition to alpha 1 antitrypsin, ceruloplasmin, anti-smooth muscle antibody, ferritin etc.  INR was increased at 1.4, prothrombin time slightly increased at 14.9 seconds.   Repeat labs on 03/17/2024 showed INR of 1.3, prothrombin time slightly increased at 13.9 seconds (range 9.6-13.1). PTT was normal at 28.2.    Given persistent elevation in PT/INR with  normal PTT, referral was sent to us  for further evaluation.   No unusual bleeding, such as blood in stools, black stools, gum bleeding, or excessive bruising.   There is no family history of blood clots or bleeding problems, although her mother experiences slightly prolonged bleeding from cuts. She does not routinely take Tylenol  or other pain medications and has not consumed alcohol since April 2025.  On her consultation with us  on 05/15/2024, CBCD, CMP were unremarkable.  Prothrombin time was normal based on our lab system at 14.7 seconds.  INR was normal at 1.1.  PTT was also normal at 29 seconds.  Fibrinogen  was borderline high at 494 mg/dL.  Factor VII activity was decreased at 35% (range 51 to 186%).  On mixing studies, minimally extended prothrombin time corrected into the normal range on 1:1 mix with normal plasma as would be expected with deficiency of one or more extrinsic pathway factors (VII, X, V, II) or fibrinogen . Diminished extrinsic pathway factor levels can be seen in liver disease and vitamin K deficiency or antagonism.  Intermittently prolonged PT/INR may be related to underlying liver issues.   She was advised to increase dietary intake of vitamin K-rich foods such as kale, spinach, collard greens, turnip greens, Brussel sprouts, broccoli, and asparagus to support factor VII production.   Plan to see her again in 4 months  for follow-up.  She was provided reassurance.  REVIEW OF SYSTEMS:    Review of Systems - Oncology  All other pertinent systems were reviewed with the patient and are negative.  I have reviewed the past medical history, past surgical history, social history and family history with the patient and they are unchanged from previous note.  ALLERGIES:  She is allergic to latex, mango flavoring agent (non-screening), pineapple, and tomato.  MEDICATIONS:  Current Outpatient Medications  Medication Sig Dispense Refill   cetirizine  (ZYRTEC  ALLERGY) 10 MG tablet  Take 1 tablet (10 mg total) by mouth daily as needed for allergies. 90 tablet 2   EPINEPHrine  0.3 mg/0.3 mL IJ SOAJ injection Inject 0.3 mg into the muscle as needed for anaphylaxis. 1 each 0   famotidine  (PEPCID ) 20 MG tablet Take 1 tablet (20 mg total) by mouth 2 (two) times daily as needed for heartburn or indigestion. 60 tablet 3   Norethindrone Acetate-Ethinyl Estrad-FE (LOESTRIN 24 FE) 1-20 MG-MCG(24) tablet Take 1 tablet by mouth daily. (Patient not taking: Reported on 05/15/2024) 84 tablet 3   No current facility-administered medications for this visit.    PHYSICAL EXAMINATION:    Onc Performance Status - 05/28/24 1500       ECOG Perf Status   ECOG Perf Status Fully active, able to carry on all pre-disease performance without restriction      KPS SCALE   KPS % SCORE Normal, no compliants, no evidence of disease          LABORATORY DATA:   I have reviewed the data as listed.  Recent Results (from the past 2160 hours)  Vitamin E     Status: None   Collection Time: 03/17/24  2:49 PM  Result Value Ref Range   Vitamin E (Alpha Tocopherol) 12.5 5.7 - 19.9 mg/L    Comment: Levels of alpha-tocopherol <5 mg/L are consistent with Vitamin E deficiency in adults.   Gamma-Tocopherol (Vit E) 1.0 <4.4 mg/L    Comment: (Note) . Vitamin supplementation within 24 hours prior to blood  draw may affect the accuracy of results. . This test was developed and its analytical performance  characteristics have been determined by [employer]. It has not been cleared or approved by the  FDA. This assay has been validated pursuant to the CLIA  regulations and is used for clinical purposes. . [facility] [address] [place] [address] [phone] Johanna Agent L. Gino, MD, PhD   Vitamin A      Status: None   Collection Time: 03/17/24  2:49 PM  Result Value Ref Range   Vitamin A  (Retinoic Acid) 41 38 - 98 mcg/dL    Comment: (Note) . **Clin  Chem Vol. 34.No.8. [id number]. 1998 Vitamin supplementation within 24 hours prior to blood  draw may affect the accuracy of results. . This test was developed and its analytical performance  characteristics have been determined by [employer]. It has not been cleared or approved by the  FDA. This assay has been validated pursuant to the CLIA  regulations and is used for clinical purposes. . [facility] [address] [place] [address] [phone] Johanna Agent L. Frame, MD, PhD   Vitamin D  (25 hydroxy)     Status: Abnormal   Collection Time: 03/17/24  2:49 PM  Result Value Ref Range   VITD 20.95 (L) 30.00 - 100.00 ng/mL  PTT     Status: None   Collection  Time: 03/17/24  2:49 PM  Result Value Ref Range   aPTT 28.2 25.4 - 36.8 SEC  INR/PT     Status: Abnormal   Collection Time: 03/17/24  2:49 PM  Result Value Ref Range   INR 1.3 (H) 0.8 - 1.0 ratio   Prothrombin Time 13.9 (H) 9.6 - 13.1 sec  CBC with Differential (Cancer Center Only)     Status: None   Collection Time: 05/15/24 11:31 AM  Result Value Ref Range   WBC Count 5.1 4.0 - 10.5 K/uL   RBC 4.83 3.87 - 5.11 MIL/uL   Hemoglobin 13.8 12.0 - 15.0 g/dL   HCT 59.1 63.9 - 53.9 %   MCV 84.5 80.0 - 100.0 fL   MCH 28.6 26.0 - 34.0 pg   MCHC 33.8 30.0 - 36.0 g/dL   RDW 86.9 88.4 - 84.4 %   Platelet Count 262 150 - 400 K/uL   nRBC 0.0 0.0 - 0.2 %   Neutrophils Relative % 57 %   Neutro Abs 2.8 1.7 - 7.7 K/uL   Lymphocytes Relative 36 %   Lymphs Abs 1.8 0.7 - 4.0 K/uL   Monocytes Relative 7 %   Monocytes Absolute 0.4 0.1 - 1.0 K/uL   Eosinophils Relative 0 %   Eosinophils Absolute 0.0 0.0 - 0.5 K/uL   Basophils Relative 0 %   Basophils Absolute 0.0 0.0 - 0.1 K/uL   Immature Granulocytes 0 %   Abs Immature Granulocytes 0.01 0.00 - 0.07 K/uL    Comment: Performed at [facility], [address], [place], [place] [address]  CMP (Cancer Center only)     Status:  None   Collection Time: 05/15/24 11:31 AM  Result Value Ref Range   Sodium 139 135 - 145 mmol/L   Potassium 3.9 3.5 - 5.1 mmol/L   Chloride 106 98 - 111 mmol/L   CO2 28 22 - 32 mmol/L   Glucose, Bld 84 70 - 99 mg/dL    Comment: Glucose reference range applies only to samples taken after fasting for at least 8 hours.   BUN 12 6 - 20 mg/dL   Creatinine 9.15 9.55 - 1.00 mg/dL   Calcium 9.2 8.9 - 89.6 mg/dL   Total Protein 7.3 6.5 - 8.1 g/dL   Albumin 4.2 3.5 - 5.0 g/dL   AST 18 15 - 41 U/L   ALT 16 0 - 44 U/L   Alkaline Phosphatase 93 38 - 126 U/L   Total Bilirubin 0.5 0.0 - 1.2 mg/dL   GFR, Estimated >39 >39 mL/min    Comment: (NOTE) Calculated using the CKD-EPI Creatinine Equation (2021)    Anion gap 5 5 - 15    Comment: Performed at [facility], [address], [place], [place] [address]  Protime-INR     Status: None   Collection Time: 05/15/24 11:31 AM  Result Value Ref Range   Prothrombin Time 14.7 11.4 - 15.2 seconds   INR 1.1 0.8 - 1.2    Comment: (NOTE) INR goal varies based on device and disease states. Performed at [facility], [address], [place], [place] [address]   APTT     Status: None   Collection Time: 05/15/24 11:31 AM  Result Value Ref Range   aPTT 29 24 - 36 seconds    Comment: Performed at [facility], [address], [place], [place] [address]  Fibrinogen      Status: Abnormal   Collection  Time: 05/15/24 11:31 AM  Result Value Ref Range   Fibrinogen  494 (H) 210 - 475 mg/dL    Comment: (NOTE) Fibrinogen  results may be underestimated in patients receiving thrombolytic therapy. Performed at [facility], [address] [place], [place] [address] CORRECTED ON 09/25 AT 1302: PREVIOUSLY REPORTED AS 504   Factor 7 assay     Status: Abnormal   Collection Time: 05/15/24 11:31 AM  Result Value Ref Range   Factor VII Activity 35 (L) 51 - 186 %    Comment:  (NOTE) Performed At: [facility] [address] [place], [place] [id number] Jennette Shorter MD Ey:[id number]   PT factor inhibitor (mixing study)     Status: Abnormal   Collection Time: 05/15/24 11:31 AM  Result Value Ref Range   PT 12.4 (H) 9.1 - 12.0 sec   PT 1:1NP 10.8 9.1 - 12.0 sec    Comment: (NOTE) Minimally extended prothrombin time corrected into the normal range on 1:1 mix with normal plasma as would be expected with deficiency of one or more extrinsic pathway factors (VII, X, V, II) or fibrinogen . Diminished extrinsic pathway factor levels can be seen in liver disease and vitamin K deficiency or antagonism. Monoclonal proteins can rarely cause extension of prothrombin time. It should be noted that mixing studies performed on samples with minimally extended PT results can be equivocal. Normal plasma can overcome weak inhibitors, also resulting in a correction of the study. Further testing (i.e., measurement of factor levels) is unlikely to definitively identify the cause of a minimally extended prothrombin time. Performed At: [facility] [address] [place], [place] [id number] Jennette Shorter MD Ey:[id number]      RADIOGRAPHIC STUDIES:  No recent pertinent imaging studies available to review.  Orders Placed This Encounter  Procedures   CBC with Differential (Cancer Center Only)    Standing Status:   Future    Expected Date:   09/19/2024    Expiration Date:   12/18/2024   CMP (Cancer Center only)    Standing Status:   Future    Expected Date:   09/19/2024    Expiration Date:   12/18/2024   Protime-INR    Standing Status:   Future    Expected Date:   09/19/2024    Expiration Date:   12/18/2024   APTT    Standing Status:   Future    Expected Date:   09/19/2024    Expiration Date:   12/18/2024     Future Appointments  Date Time Provider Department Center  09/19/2024  3:00 PM [facility] None  09/19/2024  3:30 PM Staisha Winiarski, [place],  MD [facility] None    This document was completed utilizing speech recognition software. Grammatical errors, random word insertions, pronoun errors, and incomplete sentences are an occasional consequence of this system due to software limitations, ambient noise, and hardware issues. Any formal questions or concerns about the content, text or information contained within the body of this dictation should be directly addressed to the provider for clarification.

## 2024-09-18 ENCOUNTER — Ambulatory Visit: Admitting: Gastroenterology

## 2024-09-19 ENCOUNTER — Inpatient Hospital Stay: Attending: Oncology

## 2024-09-19 ENCOUNTER — Inpatient Hospital Stay: Admitting: Oncology

## 2024-09-19 ENCOUNTER — Encounter: Payer: Self-pay | Admitting: Oncology

## 2024-09-19 VITALS — BP 111/78 | HR 76 | Temp 97.8°F | Resp 17 | Ht 62.0 in | Wt 187.4 lb

## 2024-09-19 DIAGNOSIS — R791 Abnormal coagulation profile: Secondary | ICD-10-CM | POA: Diagnosis present

## 2024-09-19 DIAGNOSIS — Z793 Long term (current) use of hormonal contraceptives: Secondary | ICD-10-CM | POA: Insufficient documentation

## 2024-09-19 DIAGNOSIS — R7989 Other specified abnormal findings of blood chemistry: Secondary | ICD-10-CM | POA: Diagnosis present

## 2024-09-19 DIAGNOSIS — K7581 Nonalcoholic steatohepatitis (NASH): Secondary | ICD-10-CM | POA: Diagnosis not present

## 2024-09-19 LAB — CMP (CANCER CENTER ONLY)
ALT: 16 U/L (ref 0–44)
AST: 16 U/L (ref 15–41)
Albumin: 4 g/dL (ref 3.5–5.0)
Alkaline Phosphatase: 107 U/L (ref 38–126)
Anion gap: 8 (ref 5–15)
BUN: 9 mg/dL (ref 6–20)
CO2: 29 mmol/L (ref 22–32)
Calcium: 8.9 mg/dL (ref 8.9–10.3)
Chloride: 102 mmol/L (ref 98–111)
Creatinine: 0.65 mg/dL (ref 0.44–1.00)
GFR, Estimated: >60 mL/min
Glucose, Bld: 87 mg/dL (ref 70–99)
Potassium: 3.8 mmol/L (ref 3.5–5.1)
Sodium: 139 mmol/L (ref 135–145)
Total Bilirubin: 0.5 mg/dL (ref 0.0–1.2)
Total Protein: 6.6 g/dL (ref 6.5–8.1)

## 2024-09-19 LAB — CBC WITH DIFFERENTIAL (CANCER CENTER ONLY)
Abs Immature Granulocytes: 0.01 10*3/uL (ref 0.00–0.07)
Basophils Absolute: 0 10*3/uL (ref 0.0–0.1)
Basophils Relative: 0 %
Eosinophils Absolute: 0.1 10*3/uL (ref 0.0–0.5)
Eosinophils Relative: 2 %
HCT: 38 % (ref 36.0–46.0)
Hemoglobin: 12.8 g/dL (ref 12.0–15.0)
Immature Granulocytes: 0 %
Lymphocytes Relative: 43 %
Lymphs Abs: 2 10*3/uL (ref 0.7–4.0)
MCH: 28.3 pg (ref 26.0–34.0)
MCHC: 33.7 g/dL (ref 30.0–36.0)
MCV: 83.9 fL (ref 80.0–100.0)
Monocytes Absolute: 0.5 10*3/uL (ref 0.1–1.0)
Monocytes Relative: 11 %
Neutro Abs: 2.1 10*3/uL (ref 1.7–7.7)
Neutrophils Relative %: 44 %
Platelet Count: 255 10*3/uL (ref 150–400)
RBC: 4.53 MIL/uL (ref 3.87–5.11)
RDW: 13.9 % (ref 11.5–15.5)
WBC Count: 4.7 10*3/uL (ref 4.0–10.5)
nRBC: 0 % (ref 0.0–0.2)

## 2024-09-19 LAB — PROTIME-INR
INR: 1.1 (ref 0.8–1.2)
Prothrombin Time: 15 s (ref 11.4–15.2)

## 2024-09-19 LAB — APTT: aPTT: 30 s (ref 24–36)

## 2024-09-19 NOTE — Progress Notes (Signed)
 "  [facility]  HEMATOLOGY CLINIC PROGRESS NOTE  PATIENT NAME: Rachel Beck   MR#: [id number] DOB: Dec 20, 1995  Patient Care Team: Ozell Heron HERO, MD as PCP - General (Family Medicine) May, Deanna J, NP as Nurse Practitioner (Gastroenterology) San Sandor GAILS, DO as Consulting Physician (Gastroenterology)  Date of visit: 09/19/2024   ASSESSMENT & PLAN:   Rachel Beck is a 29 y.o. lady with a past medical history of GERD, currently undergoing GI workup for nonalcoholic steatohepatitis (NASH), was referred to our service for evaluation of elevated PT/INR with normal PTT.    Prolonged pt (prothrombin time) In March and April 2025, she had slightly elevated AST and ALT levels.  Abdominal ultrasound on 01/02/2024 showed coarsened increased echotexture of the liver, nonspecific but suggestive of fatty infiltration of liver.  This led to GI referral.    She had initial consultation with GI Dr. San on 02/11/2024.  As part of workup for fatty liver, PT/INR was checked in addition to alpha 1 antitrypsin, ceruloplasmin, anti-smooth muscle antibody, ferritin etc.  INR was increased at 1.4, prothrombin time slightly increased at 14.9 seconds.  Repeat labs on 03/17/2024 showed INR of 1.3, prothrombin time slightly increased at 13.9 seconds (range 9.6-13.1). PTT was normal at 28.2.   Given persistent elevation in PT/INR with normal PTT, referral was sent to us  for further evaluation.  No symptoms of unusual bleeding or bruising and no family history of bleeding disorders.   On her consultation with us  on 05/15/2024, CBCD, CMP were unremarkable.  Prothrombin time was normal based on our lab system at 14.7 seconds.  INR was normal at 1.1.  PTT was also normal at 29 seconds.  Fibrinogen  was borderline high at 494 mg/dL.  Factor VII activity was decreased at 35% (range 51 to 186%).  On mixing studies, minimally extended prothrombin time corrected into the normal  range on 1:1 mix with normal plasma as would be expected with deficiency of one or more extrinsic pathway factors (VII, X, V, II) or fibrinogen . Diminished extrinsic pathway factor levels can be seen in liver disease and vitamin K deficiency or antagonism.  Repeat labs today showed normal INR of 1.1, normal PTT of 30 seconds.  CBC and CMP are entirely unremarkable.  Intermittently prolonged PT/INR may be related to underlying liver issues.  - Sent recommendations and records to her gastroenterologist and primary care provider. - No further hematology follow-up required unless INR is persistently elevated (=1.5) or abnormal results recur. - Advised repeat INR testing in one month if a single abnormal value occurs in the future.   She was advised to increase dietary intake of vitamin K-rich foods such as kale, spinach, collard greens, turnip greens, Brussel sprouts, broccoli, and asparagus to support factor VII production.   Since no additional hematological workup or intervention is needed, patient can be discharged from our office for continued follow-up with her PCP and other subspecialists.  Please reach out to us  with any questions or concerns.  Please reconsult us  as necessary.  Thank you for giving us  an opportunity to participate in her care.    I spent a total of 23 minutes during this encounter with the patient including review of chart and various tests results, discussions about plan of care and coordination of care plan.  I reviewed lab results and outside records for this visit and discussed relevant results with the patient. Diagnosis, plan of care and treatment options were also discussed in detail with the  patient. Opportunity provided to ask questions and answers provided to her apparent satisfaction. Provided instructions to call our clinic with any problems, questions or concerns prior to return visit. I recommended to continue follow-up with PCP and sub-specialists. She  verbalized understanding and agreed with the plan. No barriers to learning was detected.  Chinita Patten, MD  09/19/2024 5:36 PM  [facility] - A DEPT OF [facility] [address] [place] [address] Dept: [phone] Dept Fax: [phone]   CHIEF COMPLAINT/ REASON FOR VISIT:  Follow-up for history of elevated PT/INR with normal PTT.  Grossly negative hematological workup.  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  INTERVAL HISTORY:  Discussed the use of AI scribe software for clinical note transcription with the patient, who gave verbal consent to proceed.  History of Present Illness Rachel Beck is a 29 year old female with nonalcoholic steatohepatitis who presents for follow-up of previously abnormal coagulation studies.  She was referred for evaluation of elevated INR values (1.4 and 1.3) and intermittent prolonged PT/INR. Over the past four months, she has remained clinically stable without symptoms of abnormal bleeding, bruising, or thrombosis. She denies any new or enlarging masses.  Recent laboratory studies, including INR, PT, PTT, liver function tests, kidney function, electrolytes, and blood counts, have normalized, with today's INR at 1.1.  She is not taking anticoagulants or vitamin K supplementation and has had no medication changes relevant to coagulation.  May 28, 2024: Follow-up visit for evaluation of abnormal coagulation studies, specifically prolonged PT/INR with normal PTT. Reviewed recent labs showing normalization of PT/INR but persistently low Factor VII activity, likely related to underlying liver disease or vitamin K deficiency. Advised to increase dietary vitamin K intake and follow up in four months; patient clinically stable.    SUMMARY OF HEMATOLOGIC HISTORY:  In March and April 2025, she had slightly elevated AST and ALT levels.  Abdominal  ultrasound on 01/02/2024 showed coarsened increased echotexture of the liver, nonspecific but suggestive of fatty infiltration of liver.  This led to GI referral.     She had initial consultation with GI Dr. San on 02/11/2024.  As part of workup for fatty liver, PT/INR was checked in addition to alpha 1 antitrypsin, ceruloplasmin, anti-smooth muscle antibody, ferritin etc.  INR was increased at 1.4, prothrombin time slightly increased at 14.9 seconds.   Repeat labs on 03/17/2024 showed INR of 1.3, prothrombin time slightly increased at 13.9 seconds (range 9.6-13.1). PTT was normal at 28.2.    Given persistent elevation in PT/INR with normal PTT, referral was sent to us  for further evaluation.   No unusual bleeding, such as blood in stools, black stools, gum bleeding, or excessive bruising.   There is no family history of blood clots or bleeding problems, although her mother experiences slightly prolonged bleeding from cuts. She does not routinely take Tylenol  or other pain medications and has not consumed alcohol since April 2025.   On her consultation with us  on 05/15/2024, CBCD, CMP were unremarkable.  Prothrombin time was normal based on our lab system at 14.7 seconds.  INR was normal at 1.1.  PTT was also normal at 29 seconds.  Fibrinogen  was borderline high at 494 mg/dL.  Factor VII activity was decreased at 35% (range 51 to 186%).  On mixing studies, minimally extended prothrombin time corrected into the normal range on 1:1 mix with normal plasma as would be expected with deficiency of one  or more extrinsic pathway factors (VII, X, V, II) or fibrinogen . Diminished extrinsic pathway factor levels can be seen in liver disease and vitamin K deficiency or antagonism.   Intermittently prolonged PT/INR may be related to underlying liver issues.   She was advised to increase dietary intake of vitamin K-rich foods such as kale, spinach, collard greens, turnip greens, Brussel sprouts, broccoli, and  asparagus to support factor VII production.   PT, PTT were both within normal limits on follow-up visit on 09/19/2024.  Since no additional hematological workup was planned, she was discharged from our office for continued follow-up with her PCP and gastroenterologist.  I have reviewed the past medical history, past surgical history, social history and family history with the patient and they are unchanged from previous note.  ALLERGIES: She is allergic to latex, mango flavoring agent (non-screening), pineapple, and tomato.  MEDICATIONS:  Current Outpatient Medications  Medication Sig Dispense Refill   cetirizine  (ZYRTEC  ALLERGY) 10 MG tablet Take 1 tablet (10 mg total) by mouth daily as needed for allergies. 90 tablet 2   EPINEPHrine  0.3 mg/0.3 mL IJ SOAJ injection Inject 0.3 mg into the muscle as needed for anaphylaxis. 1 each 0   famotidine  (PEPCID ) 20 MG tablet Take 1 tablet (20 mg total) by mouth 2 (two) times daily as needed for heartburn or indigestion. 60 tablet 3   Norethindrone Acetate-Ethinyl Estrad-FE (LOESTRIN 24 FE) 1-20 MG-MCG(24) tablet Take 1 tablet by mouth daily. (Patient not taking: Reported on 09/19/2024) 84 tablet 3   No current facility-administered medications for this visit.     REVIEW OF SYSTEMS:    Review of Systems - Oncology  All other pertinent systems were reviewed with the patient and are negative.  PHYSICAL EXAMINATION:   Onc Performance Status - 09/19/24 1500       ECOG Perf Status   ECOG Perf Status Fully active, able to carry on all pre-disease performance without restriction      KPS SCALE   KPS % SCORE Normal, no compliants, no evidence of disease          Vitals:   09/19/24 1500  BP: 111/78  Pulse: 76  Resp: 17  Temp: 97.8 F (36.6 C)  SpO2: 100%   Filed Weights   09/19/24 1500  Weight: 187 lb 6.4 oz (85 kg)    Physical Exam Constitutional:      General: She is not in acute distress.    Appearance: Normal appearance.   HENT:     Head: Normocephalic and atraumatic.  Cardiovascular:     Rate and Rhythm: Normal rate.  Pulmonary:     Effort: Pulmonary effort is normal. No respiratory distress.  Abdominal:     General: There is no distension.  Neurological:     General: No focal deficit present.     Mental Status: She is alert and oriented to person, place, and time.  Psychiatric:        Mood and Affect: Mood normal.        Behavior: Behavior normal.    LABORATORY DATA:   I have reviewed the data as listed.  Results for orders placed or performed in visit on 09/19/24  APTT  Result Value Ref Range   aPTT 30 24 - 36 seconds  Protime-INR  Result Value Ref Range   Prothrombin Time 15.0 11.4 - 15.2 seconds   INR 1.1 0.8 - 1.2  CMP (Cancer Center only)  Result Value Ref Range   Sodium 139 135 -  145 mmol/L   Potassium 3.8 3.5 - 5.1 mmol/L   Chloride 102 98 - 111 mmol/L   CO2 29 22 - 32 mmol/L   Glucose, Bld 87 70 - 99 mg/dL   BUN 9 6 - 20 mg/dL   Creatinine 9.34 9.55 - 1.00 mg/dL   Calcium 8.9 8.9 - 89.6 mg/dL   Total Protein 6.6 6.5 - 8.1 g/dL   Albumin 4.0 3.5 - 5.0 g/dL   AST 16 15 - 41 U/L   ALT 16 0 - 44 U/L   Alkaline Phosphatase 107 38 - 126 U/L   Total Bilirubin 0.5 0.0 - 1.2 mg/dL   GFR, Estimated >39 >39 mL/min   Anion gap 8 5 - 15  CBC with Differential (Cancer Center Only)  Result Value Ref Range   WBC Count 4.7 4.0 - 10.5 K/uL   RBC 4.53 3.87 - 5.11 MIL/uL   Hemoglobin 12.8 12.0 - 15.0 g/dL   HCT 61.9 63.9 - 53.9 %   MCV 83.9 80.0 - 100.0 fL   MCH 28.3 26.0 - 34.0 pg   MCHC 33.7 30.0 - 36.0 g/dL   RDW 86.0 88.4 - 84.4 %   Platelet Count 255 150 - 400 K/uL   nRBC 0.0 0.0 - 0.2 %   Neutrophils Relative % 44 %   Neutro Abs 2.1 1.7 - 7.7 K/uL   Lymphocytes Relative 43 %   Lymphs Abs 2.0 0.7 - 4.0 K/uL   Monocytes Relative 11 %   Monocytes Absolute 0.5 0.1 - 1.0 K/uL   Eosinophils Relative 2 %   Eosinophils Absolute 0.1 0.0 - 0.5 K/uL   Basophils Relative 0 %    Basophils Absolute 0.0 0.0 - 0.1 K/uL   Immature Granulocytes 0 %   Abs Immature Granulocytes 0.01 0.00 - 0.07 K/uL     RADIOGRAPHIC STUDIES:  I have personally reviewed the radiological images as listed and agree with the findings in the report.  US  Abdomen Complete CLINICAL DATA:  Abnormal liver enzymes  EXAM: ABDOMEN ULTRASOUND COMPLETE  COMPARISON:  None Available.  FINDINGS: Gallbladder: No gallstones or wall thickening visualized. No sonographic Murphy sign noted by sonographer.  Common bile duct: Diameter: 2.2 mm  Liver: No focal lesion. Coarsened increased echotexture of the liver. Portal vein is patent on color Doppler imaging with normal direction of blood flow towards the liver.  IVC: No abnormality visualized.  Pancreas: Visualized portion unremarkable.  Spleen: Size and appearance within normal limits.  Right Kidney: Length: 10.3 cm. Echogenicity within normal limits. No mass or hydronephrosis visualized.  Left Kidney: Length: 11.6 cm. Echogenicity within normal limits. No mass or hydronephrosis visualized.  Abdominal aorta: No aneurysm visualized.  Other findings: None.  IMPRESSION: 1. No acute abnormality identified. 2. Coarsened increased echotexture of the liver. This is nonspecific but can be seen in fatty infiltration of liver.  Electronically Signed   By: Craig Farr M.D.   On: 01/02/2024 08:56   No orders of the defined types were placed in this encounter.    Future Appointments  Date Time Provider Department Center  10/01/2024 10:00 AM May, Deanna J, NP [facility]     This document was completed utilizing speech recognition software. Grammatical errors, random word insertions, pronoun errors, and incomplete sentences are an occasional consequence of this system due to software limitations, ambient noise, and hardware issues. Any formal questions or concerns about the content, text or information contained within the body of  this dictation should be directly  addressed to the provider for clarification.  "

## 2024-09-19 NOTE — Assessment & Plan Note (Addendum)
 In March and April 2025, she had slightly elevated AST and ALT levels.  Abdominal ultrasound on 01/02/2024 showed coarsened increased echotexture of the liver, nonspecific but suggestive of fatty infiltration of liver.  This led to GI referral.    She had initial consultation with GI Dr. San on 02/11/2024.  As part of workup for fatty liver, PT/INR was checked in addition to alpha 1 antitrypsin, ceruloplasmin, anti-smooth muscle antibody, ferritin etc.  INR was increased at 1.4, prothrombin time slightly increased at 14.9 seconds.  Repeat labs on 03/17/2024 showed INR of 1.3, prothrombin time slightly increased at 13.9 seconds (range 9.6-13.1). PTT was normal at 28.2.   Given persistent elevation in PT/INR with normal PTT, referral was sent to us  for further evaluation.  No symptoms of unusual bleeding or bruising and no family history of bleeding disorders.   On her consultation with us  on 05/15/2024, CBCD, CMP were unremarkable.  Prothrombin time was normal based on our lab system at 14.7 seconds.  INR was normal at 1.1.  PTT was also normal at 29 seconds.  Fibrinogen  was borderline high at 494 mg/dL.  Factor VII activity was decreased at 35% (range 51 to 186%).  On mixing studies, minimally extended prothrombin time corrected into the normal range on 1:1 mix with normal plasma as would be expected with deficiency of one or more extrinsic pathway factors (VII, X, V, II) or fibrinogen . Diminished extrinsic pathway factor levels can be seen in liver disease and vitamin K deficiency or antagonism.  Repeat labs today showed normal INR of 1.1, normal PTT of 30 seconds.  CBC and CMP are entirely unremarkable.  Intermittently prolonged PT/INR may be related to underlying liver issues.  - Sent recommendations and records to her gastroenterologist and primary care provider. - No further hematology follow-up required unless INR is persistently elevated (=1.5) or abnormal results recur. - Advised repeat  INR testing in one month if a single abnormal value occurs in the future.   She was advised to increase dietary intake of vitamin K-rich foods such as kale, spinach, collard greens, turnip greens, Brussel sprouts, broccoli, and asparagus to support factor VII production.   Since no additional hematological workup or intervention is needed, patient can be discharged from our office for continued follow-up with her PCP and other subspecialists.  Please reach out to us  with any questions or concerns.  Please reconsult us  as necessary.  Thank you for giving us  an opportunity to participate in her care.

## 2024-10-01 ENCOUNTER — Ambulatory Visit: Admitting: Gastroenterology
